# Patient Record
Sex: Male | Born: 1955
Health system: Southern US, Community
[De-identification: ages and names within clinical notes are randomized; demographics above are authoritative.]

## PROBLEM LIST (undated history)

## (undated) DIAGNOSIS — I10 Essential (primary) hypertension: Secondary | ICD-10-CM

## (undated) DIAGNOSIS — C61 Malignant neoplasm of prostate: Secondary | ICD-10-CM

## (undated) HISTORY — PX: OTHER SURGICAL HISTORY: SHX169

## (undated) HISTORY — PX: KIDNEY SURGERY: SHX687

---

## 1999-07-27 ENCOUNTER — Emergency Department (HOSPITAL_COMMUNITY): Admission: EM | Admit: 1999-07-27 | Discharge: 1999-07-28 | Payer: Self-pay | Admitting: Emergency Medicine

## 1999-07-28 ENCOUNTER — Encounter: Payer: Self-pay | Admitting: Emergency Medicine

## 1999-08-04 ENCOUNTER — Ambulatory Visit (HOSPITAL_COMMUNITY): Admission: RE | Admit: 1999-08-04 | Discharge: 1999-08-04 | Payer: Self-pay | Admitting: Urology

## 1999-08-04 ENCOUNTER — Encounter: Payer: Self-pay | Admitting: Urology

## 1999-08-21 ENCOUNTER — Encounter: Payer: Self-pay | Admitting: Urology

## 1999-08-21 ENCOUNTER — Ambulatory Visit (HOSPITAL_COMMUNITY): Admission: RE | Admit: 1999-08-21 | Discharge: 1999-08-21 | Payer: Self-pay | Admitting: Urology

## 2000-08-12 ENCOUNTER — Encounter: Payer: Self-pay | Admitting: Ophthalmology

## 2000-08-12 ENCOUNTER — Ambulatory Visit (HOSPITAL_COMMUNITY): Admission: RE | Admit: 2000-08-12 | Discharge: 2000-08-12 | Payer: Self-pay | Admitting: Ophthalmology

## 2000-08-27 ENCOUNTER — Encounter: Payer: Self-pay | Admitting: Urology

## 2000-08-27 ENCOUNTER — Ambulatory Visit (HOSPITAL_COMMUNITY): Admission: RE | Admit: 2000-08-27 | Discharge: 2000-08-27 | Payer: Self-pay | Admitting: Urology

## 2000-08-30 ENCOUNTER — Ambulatory Visit (HOSPITAL_COMMUNITY): Admission: RE | Admit: 2000-08-30 | Discharge: 2000-08-30 | Payer: Self-pay | Admitting: Urology

## 2000-08-30 ENCOUNTER — Encounter: Payer: Self-pay | Admitting: Urology

## 2000-09-27 ENCOUNTER — Inpatient Hospital Stay (HOSPITAL_COMMUNITY): Admission: RE | Admit: 2000-09-27 | Discharge: 2000-09-30 | Payer: Self-pay | Admitting: Urology

## 2000-11-08 ENCOUNTER — Ambulatory Visit (HOSPITAL_COMMUNITY): Admission: RE | Admit: 2000-11-08 | Discharge: 2000-11-08 | Payer: Self-pay | Admitting: Urology

## 2002-12-18 ENCOUNTER — Ambulatory Visit: Admission: RE | Admit: 2002-12-18 | Discharge: 2002-12-18 | Payer: Self-pay | Admitting: Pulmonary Disease

## 2003-02-19 ENCOUNTER — Ambulatory Visit: Admission: RE | Admit: 2003-02-19 | Discharge: 2003-02-19 | Payer: Self-pay | Admitting: Internal Medicine

## 2004-05-28 ENCOUNTER — Ambulatory Visit: Payer: Self-pay | Admitting: Cardiology

## 2004-05-28 ENCOUNTER — Ambulatory Visit (HOSPITAL_COMMUNITY): Admission: RE | Admit: 2004-05-28 | Discharge: 2004-05-28 | Payer: Self-pay | Admitting: Pulmonary Disease

## 2010-10-28 ENCOUNTER — Other Ambulatory Visit (HOSPITAL_COMMUNITY): Payer: Self-pay | Admitting: Urology

## 2010-10-28 DIAGNOSIS — C61 Malignant neoplasm of prostate: Secondary | ICD-10-CM

## 2010-11-06 ENCOUNTER — Encounter (HOSPITAL_COMMUNITY): Payer: Self-pay

## 2010-11-06 ENCOUNTER — Encounter (HOSPITAL_COMMUNITY)
Admission: RE | Admit: 2010-11-06 | Discharge: 2010-11-06 | Disposition: A | Discharge status: Home / Self Care | Payer: Federal, State, Local not specified - PPO | Source: Ambulatory Visit | Attending: Urology | Admitting: Urology

## 2010-11-06 DIAGNOSIS — C61 Malignant neoplasm of prostate: Secondary | ICD-10-CM | POA: Insufficient documentation

## 2010-11-06 HISTORY — DX: Malignant neoplasm of prostate: C61

## 2010-11-06 MED ORDER — TECHNETIUM TC 99M MEDRONATE IV KIT
23.4000 | PACK | Freq: Once | INTRAVENOUS | Status: AC | PRN
  Administered 2010-11-06: 23.4 via INTRAVENOUS

## 2010-11-25 ENCOUNTER — Other Ambulatory Visit: Payer: Self-pay | Admitting: Urology

## 2010-11-25 ENCOUNTER — Encounter (HOSPITAL_COMMUNITY): Payer: Federal, State, Local not specified - PPO

## 2010-11-25 ENCOUNTER — Ambulatory Visit (HOSPITAL_COMMUNITY)
Admission: RE | Admit: 2010-11-25 | Discharge: 2010-11-25 | Disposition: A | Discharge status: Home / Self Care | Payer: Federal, State, Local not specified - PPO | Source: Ambulatory Visit | Attending: Urology | Admitting: Urology

## 2010-11-25 ENCOUNTER — Other Ambulatory Visit (HOSPITAL_COMMUNITY): Payer: Self-pay | Admitting: Urology

## 2010-11-25 DIAGNOSIS — Z01818 Encounter for other preprocedural examination: Secondary | ICD-10-CM

## 2010-11-25 DIAGNOSIS — Z01812 Encounter for preprocedural laboratory examination: Secondary | ICD-10-CM | POA: Insufficient documentation

## 2010-11-25 DIAGNOSIS — Z01810 Encounter for preprocedural cardiovascular examination: Secondary | ICD-10-CM | POA: Insufficient documentation

## 2010-11-25 LAB — CBC
MCH: 28.5 pg (ref 26.0–34.0)
MCHC: 33.5 g/dL (ref 30.0–36.0)
MCV: 85 fL (ref 78.0–100.0)
Platelets: 222 10*3/uL (ref 150–400)
RBC: 4.81 MIL/uL (ref 4.22–5.81)
RDW: 12 % (ref 11.5–15.5)

## 2010-11-25 LAB — SURGICAL PCR SCREEN
MRSA, PCR: NEGATIVE
Staphylococcus aureus: NEGATIVE

## 2010-11-25 LAB — BASIC METABOLIC PANEL
Calcium: 9.6 mg/dL (ref 8.4–10.5)
Creatinine, Ser: 1.18 mg/dL (ref 0.50–1.35)
GFR calc non Af Amer: >60 mL/min (ref >60)
Sodium: 137 mEq/L (ref 135–145)

## 2010-12-01 ENCOUNTER — Inpatient Hospital Stay (HOSPITAL_COMMUNITY)
Admission: RE | Admit: 2010-12-01 | Discharge: 2010-12-02 | DRG: 335 | Disposition: A | Discharge status: Home / Self Care | Payer: Federal, State, Local not specified - PPO | Source: Ambulatory Visit | Attending: Urology | Admitting: Urology

## 2010-12-01 ENCOUNTER — Other Ambulatory Visit: Payer: Self-pay | Admitting: Urology

## 2010-12-01 DIAGNOSIS — Z01810 Encounter for preprocedural cardiovascular examination: Secondary | ICD-10-CM

## 2010-12-01 DIAGNOSIS — C61 Malignant neoplasm of prostate: Principal | ICD-10-CM | POA: Diagnosis present

## 2010-12-01 DIAGNOSIS — Y9210 Unspecified residential institution as the place of occurrence of the external cause: Secondary | ICD-10-CM | POA: Diagnosis not present

## 2010-12-01 DIAGNOSIS — Z01812 Encounter for preprocedural laboratory examination: Secondary | ICD-10-CM

## 2010-12-01 DIAGNOSIS — IMO0002 Reserved for concepts with insufficient information to code with codable children: Secondary | ICD-10-CM | POA: Diagnosis not present

## 2010-12-01 LAB — CBC
HCT: 38 % — ABNORMAL LOW (ref 39.0–52.0)
Hemoglobin: 12.7 g/dL — ABNORMAL LOW (ref 13.0–17.0)
MCH: 28.4 pg (ref 26.0–34.0)
MCHC: 33.4 g/dL (ref 30.0–36.0)
RBC: 4.47 MIL/uL (ref 4.22–5.81)

## 2010-12-01 LAB — BASIC METABOLIC PANEL
BUN: 12 mg/dL (ref 6–23)
CO2: 27 mEq/L (ref 19–32)
Calcium: 8.9 mg/dL (ref 8.4–10.5)
GFR calc non Af Amer: >60 mL/min (ref >60)
Glucose, Bld: 141 mg/dL — ABNORMAL HIGH (ref 70–99)
Potassium: 3.8 mEq/L (ref 3.5–5.1)
Sodium: 136 mEq/L (ref 135–145)

## 2010-12-02 LAB — BASIC METABOLIC PANEL
BUN: 9 mg/dL (ref 6–23)
Calcium: 8.8 mg/dL (ref 8.4–10.5)
Creatinine, Ser: 1.15 mg/dL (ref 0.50–1.35)
GFR calc Af Amer: >60 mL/min (ref >60)
GFR calc non Af Amer: >60 mL/min (ref >60)
Glucose, Bld: 125 mg/dL — ABNORMAL HIGH (ref 70–99)

## 2010-12-02 LAB — CBC
HCT: 36.9 % — ABNORMAL LOW (ref 39.0–52.0)
Hemoglobin: 12.5 g/dL — ABNORMAL LOW (ref 13.0–17.0)
MCH: 28.9 pg (ref 26.0–34.0)
MCHC: 33.9 g/dL (ref 30.0–36.0)
RDW: 12.3 % (ref 11.5–15.5)

## 2010-12-02 LAB — DIFFERENTIAL
Basophils Absolute: 0 10*3/uL (ref 0.0–0.1)
Eosinophils Relative: 2 % (ref 0–5)
Lymphocytes Relative: 13 % (ref 12–46)
Monocytes Absolute: 0.5 10*3/uL (ref 0.1–1.0)
Monocytes Relative: 8 % (ref 3–12)
Neutro Abs: 4.5 10*3/uL (ref 1.7–7.7)

## 2010-12-10 NOTE — Op Note (Signed)
NAMEJEZREEL, JUSTINIANO NO.:  [id number]  MEDICAL RECORD NO.:  [id number]  LOCATION:                                 FACILITY:  PHYSICIAN:  Valetta Fuller, MD    DATE OF BIRTH:  [birth year]  DATE OF PROCEDURE: DATE OF DISCHARGE:                              OPERATIVE REPORT   PREOPERATIVE DIAGNOSIS:  Adenocarcinoma of the prostate.  POSTOPERATIVE DIAGNOSIS:  Adenocarcinoma of the prostate.  PROCEDURE PERFORMED:  Robotic-assisted laparoscopic radical retropubic prostatectomy with bilateral pelvic lymph node dissection.  SURGEON:  Valetta Fuller, MD  FIRST ASSISTANT:  Delia Chimes, NP.  ANESTHESIA:  General endotracheal.  COMPLICATIONS:  Small rectal injury with intraoperative general surgery consultation.  INDICATIONS:  Mr. Longest is 55 years of age.  He was sent to see [place] with an elevated PSA of approximately 10.  Rectal exam revealed no obvious nodularity or induration.  An ultrasound was performed, which revealed a small but otherwise unremarkable prostate.  Right-sided biopsies were all negative other than some high-grade PIN and some atypia. Left-sided biopsies were positive in 6/6 cores.  Primarily, this was a Gleason 3 + 4 equals 7, but one core at the left base did show a primary Gleason 4 component.  Core involvement was in the 50% to 80% range.  One of the cores did show a question of some extraprostatic extension.  Perineal invasion was also noted.  The patient underwent extensive consultation with regard to his treatment options.  We went over [facility] nomograms.  We felt that given his young age, potentially aggressive disease that a surgical approach would offer him the best opportunity for not only careful pathologic staging, but his best chance at cure.  The patient appeared to understand the advantages and disadvantages of surgery.  He was felt to be a candidate only for unilateral nerve sparing on the right side, given his  extensive cancer on the left.  The patient did have a CT of the pelvis with contrast and bone scan, which failed to reveal gross evidence of metastatic disease. The patient has performed a mechanical bowel prep.  The patient had placement of PAS compression boots and has received perioperative Unasyn.  Full informed consent has been obtained and the patient presents now for the procedure.  TECHNIQUE AND FINDINGS:  The patient was brought to the operating room where he had successful induction of general endotracheal anesthesia. He was placed in low lithotomy position with all extremities carefully padded.  He was secured to the operative table, placed in steep Trendelenburg position, and then prepped and draped in the usual manner. Foley catheter was inserted sterilely on the field.  Initial camera port incision was chosen approximately 18 cm above the pubis symphysis just left of the umbilicus.  A standard open Hassan technique was utilized and placement of the initial 12-mm trocar and abdominal insufflation occurred without incident.  There was no obvious pathology within the pelvis.  All other trocars were placed with direct visual guidance including additional 12-mm and 5-mm assist port and three 8-mm robotic trocars.  With trocars placed, the surgical cart was then docked.  The bladder was filled and space of Retzius  developed primarily with electrocautery, scissors, and blunt dissecting technique.  A small amount of superficial fat over the endopelvic fascia and bladder neck was then removed with electrocautery scissors.  The endopelvic fascia was then opened widely bilaterally from base to apex.  Levator musculature was swept off the apex of the prostate on both sides, isolating the dorsal venous complex which was stapled with an ETS stapling device with excellent hemostasis.  Foley catheter balloon was used to identify the anterior bladder neck which was then transected with  electrocautery scissors down to the catheter, which was then retracted anteriorly.  Indigo carmine was given and we had quite a small bladder neck well away from the ureteral orifices.  No middle lobe component was identified.  The posterior bladder neck was then transected and dissection carried down to the vas deferens bilaterally, which were individually dissected free and then retracted anteriorly. Seminal vesicles were likewise dissected primarily with a combination of blunt dissection along with electrocautery scissors.  With the prostate retracted anteriorly, the posterior plane between the rectum and the prostate was established.  The majority of this plane was quite normal up until the extreme left apical region towards the mid prostate where there did appear to be some adherence.  Attention was then turned towards bilateral nerve spare.  On the right side, superficial fascia anterior laterally was incised and then swept posterior laterally until a nice groove was established.  This was carried out from the apex and lateral aspect of the urethra back towards the pedicle.  Then retracting the prostate anteriorly, we were able to identify the pedicle, which was taken down with an Enseal device, dropping down the neurovascular bundle quite nicely.  On the left side, no attempt at nerve spare was made.  We attempted to go out laterally as much as possible utilizing Enseal device to take down the pedicle as well as much of the posterior lateral tissue as possible.  As we concluded that dissection, we noticed that there was a small approximately 1 cm x 0.5 cm area of exposed rectal mucosa.  The edges of this rent were quite clean.  There was no evidence of any rectal spillage into the pelvis.  We went ahead and completed the prostate dissection by transecting the urethra anteriorly.  The catheter was then retracted and the posterior urethra was then transected.  A small amount of  rectourethralis muscle was sharply incised and the prostatic specimen after additional cold scissor dissection near the side of the rectal injury was then removed from the pelvis.  The pelvis was then copiously irrigated.  The rectal injury was again examined. Again, the edges appeared to be quite fresh and there was no rectal spillage.  We did ask Dr. Karie Soda from General Surgery to look at the injury.  He was able to come in to the operating room.  He concurred that a double layer closure with some interposition of tissue what appeared to be adequate and there did not appear to be an obvious indication of colostomy again given the very small injury, the presence of a good apparently complete bowel prep along with no evidence of any fecal spillage.  The rectal injury was then repaired.  A running 2-0 Vicryl suture was utilized to close the mucosa.  A second layer of interrupted 2-0 silk suture was utilized to buttress that closure.  We were then able to swing over some additional tissue to cover the site of the rectal injury and to  provide an additional layer of tissue between the injury and the overlying bladder.  Attention was then turned towards bilateral pelvic lymph node dissection.  Obturator packets extending up to the bifurcation of the iliac artery were taken bilaterally.  No pathologically enlarged lymph nodes were identified.  Obturator nerves were identified bilaterally and clips were used for small lymphatic channels as well as small vessels. The packets were sent separately for permanent analysis only.  Again, obturator nerves identified and preserved bilaterally.  Attention was then turned towards reconstruction.  The bladder neck required no additional closure.  Indigo carmine was again given and we were well away from the ureteral orifices.  The 6 o'clock urethral stump and 6 o'clock bladder neck positions were then reapproximated with a single 2-0 Vicryl  suture.  Once these structures were reapproximated, the rest anastomosis was done with a double-armed 3-0 Monocryl suture in a running 360-degree manner.  A new catheter was placed without difficulty.  Irrigation revealed no evidence of leakage.  Of note, after the rectal injury was complete but before the anastomosis was performed, the pelvis was again copiously irrigated.  Rectal insufflation was also performed, which revealed no evidence of obvious air bubbles or leakage and it appeared that the rectal closure was indeed watertight.  Again copious irrigation was also utilized.  A pelvic drain was placed through one of the robotic trocars and secured to the skin.  The prostate specimen was placed in the Endopouch retrieval bag.  The 12-mm assist site was closed with Vicryl suture with the aid of a suture passer.  All other trocars were taken out with direct visual guidance without evidence of bleeding.  The camera port incision was extended slightly to allow for removal of specimen and that fascia was then closed with a running Vicryl suture.  All port sites and incisions were infiltrated with Marcaine and closed with clips.  Estimated blood loss was 50 cc. Other than the small rectal rent, no other obvious complications or problems occurred.  The patient was brought to recovery room in stable condition.     Valetta Fuller, MD     DSG/MEDQ  D:  12/01/2010  T:  12/01/2010  Job:  [id number]  Electronically Signed by Barron Alvine M.D. on 12/10/2010 08:41:40 AM

## 2013-07-13 ENCOUNTER — Encounter (HOSPITAL_COMMUNITY): Payer: Self-pay | Admitting: Emergency Medicine

## 2013-07-13 ENCOUNTER — Emergency Department (HOSPITAL_COMMUNITY)
Admission: EM | Admit: 2013-07-13 | Discharge: 2013-07-13 | Disposition: A | Discharge status: Home / Self Care | Payer: Federal, State, Local not specified - PPO | Attending: Emergency Medicine | Admitting: Emergency Medicine

## 2013-07-13 DIAGNOSIS — Z8546 Personal history of malignant neoplasm of prostate: Secondary | ICD-10-CM | POA: Insufficient documentation

## 2013-07-13 DIAGNOSIS — E860 Dehydration: Secondary | ICD-10-CM | POA: Diagnosis present

## 2013-07-13 DIAGNOSIS — Z79899 Other long term (current) drug therapy: Secondary | ICD-10-CM | POA: Insufficient documentation

## 2013-07-13 DIAGNOSIS — R55 Syncope and collapse: Secondary | ICD-10-CM | POA: Diagnosis present

## 2013-07-13 DIAGNOSIS — I10 Essential (primary) hypertension: Secondary | ICD-10-CM | POA: Insufficient documentation

## 2013-07-13 HISTORY — DX: Essential (primary) hypertension: I10

## 2013-07-13 LAB — CBC WITH DIFFERENTIAL/PLATELET
Basophils Absolute: 0 10*3/uL (ref 0.0–0.1)
Basophils Relative: 0 % (ref 0–1)
EOS PCT: 0 % (ref 0–5)
Eosinophils Absolute: 0 10*3/uL (ref 0.0–0.7)
HEMATOCRIT: 39.4 % (ref 39.0–52.0)
HEMOGLOBIN: 13.1 g/dL (ref 13.0–17.0)
LYMPHS ABS: 0.7 10*3/uL (ref 0.7–4.0)
LYMPHS PCT: 12 % (ref 12–46)
MCH: 28.8 pg (ref 26.0–34.0)
MCHC: 33.2 g/dL (ref 30.0–36.0)
MCV: 86.6 fL (ref 78.0–100.0)
MONO ABS: 0.5 10*3/uL (ref 0.1–1.0)
Monocytes Relative: 8 % (ref 3–12)
Neutro Abs: 4.8 10*3/uL (ref 1.7–7.7)
Neutrophils Relative %: 80 % — ABNORMAL HIGH (ref 43–77)
Platelets: 177 10*3/uL (ref 150–400)
RBC: 4.55 MIL/uL (ref 4.22–5.81)
RDW: 12.3 % (ref 11.5–15.5)
WBC: 6 10*3/uL (ref 4.0–10.5)

## 2013-07-13 LAB — I-STAT CHEM 8, ED
BUN: 22 mg/dL (ref 6–23)
CHLORIDE: 96 meq/L (ref 96–112)
CREATININE: 1.8 mg/dL — AB (ref 0.50–1.35)
Calcium, Ion: 1.06 mmol/L — ABNORMAL LOW (ref 1.12–1.23)
GLUCOSE: 105 mg/dL — AB (ref 70–99)
HEMATOCRIT: 43 % (ref 39.0–52.0)
Hemoglobin: 14.6 g/dL (ref 13.0–17.0)
POTASSIUM: 3.6 meq/L — AB (ref 3.7–5.3)
SODIUM: 138 meq/L (ref 137–147)
TCO2: 27 mmol/L (ref 0–100)

## 2013-07-13 LAB — CBG MONITORING, ED: Glucose-Capillary: 110 mg/dL — ABNORMAL HIGH (ref 70–99)

## 2013-07-13 LAB — I-STAT TROPONIN, ED: Troponin i, poc: 0 ng/mL (ref 0.00–0.08)

## 2013-07-13 NOTE — ED Notes (Signed)
Patient passed out in [employer] twice today, both witnessed.  Patient felt lightheaded before he passed out.  CBG-126.  Denies chest pain, SOB, nausea, and vomiting.  EMS reports patient is orthostatic.  Patient receiving fluids per EMS. Patient reports not feeling well in the past several days.

## 2013-07-13 NOTE — Discharge Instructions (Signed)
Dehydration, Adult  Dehydration means your body does not have as much fluid as it needs. Your kidneys, brain, and heart will not work properly without the right amount of fluids and salt.   HOME CARE   Ask your doctor how to replace body fluid losses (rehydrate).   Drink enough fluids to keep your pee (urine) clear or pale yellow.   Drink small amounts of fluids often if you feel sick to your stomach (nauseous) or throw up (vomit).   Eat like you normally do.   Avoid:   Foods or drinks high in sugar.   Bubbly (carbonated) drinks.   Juice.   Very hot or cold fluids.   Drinks with caffeine.   Fatty, greasy foods.   Alcohol.   Tobacco.   Eating too much.   Gelatin desserts.   Wash your hands to avoid spreading germs (bacteria, viruses).   Only take medicine as told by your doctor.   Keep all doctor visits as told.  GET HELP RIGHT AWAY IF:    You cannot drink something without throwing up.   You get worse even with treatment.   Your vomit has blood in it or looks greenish.   Your poop (stool) has blood in it or looks black and tarry.   You have not peed in 6 to 8 hours.   You pee a small amount of very dark pee.   You have a fever.   You pass out (faint).   You have belly (abdominal) pain that gets worse or stays in one spot (localizes).   You have a rash, stiff neck, or bad headache.   You get easily annoyed, sleepy, or are hard to wake up.   You feel weak, dizzy, or very thirsty.  MAKE SURE YOU:    Understand these instructions.   Will watch your condition.   Will get help right away if you are not doing well or get worse.  Document Released: 01/31/2009 Document Revised: 06/29/2011 Document Reviewed: 11/24/2010  Exit[employer] Patient Information 2014 [employer].

## 2013-07-13 NOTE — ED Notes (Signed)
Bed: CZ66 Expected date:  Expected time:  Means of arrival:  Comments: Ems- syncope

## 2013-07-13 NOTE — ED Provider Notes (Signed)
CSN: [id number]     Arrival date & time 07/13/13  1404 History   First MD Initiated Contact with Patient 07/13/13 1500     Chief Complaint  Patient presents with  . Loss of Consciousness     (Consider location/radiation/quality/duration/timing/severity/associated sxs/prior Treatment) Patient is a 58 y.o. male presenting with syncope. The history is provided by the patient.  Loss of Consciousness Episode history: at least one episode, bystanders noted 2 episodes. Most recent episode:  Today Duration:  5 minutes Timing: once, possibly twice. Progression:  Resolved Chronicity: new, one episode of syncope 4 years ago. Context comment:  Standing in line Witnessed: yes   Relieved by:  Nothing Worsened by:  Nothing tried Ineffective treatments:  None tried Associated symptoms: no chest pain, no fever, no headaches, no nausea, no shortness of breath and no vomiting     Past Medical History  Diagnosis Date  . Prostate cancer   . Hypertension    History reviewed. No pertinent past surgical history. History reviewed. No pertinent family history. History  Substance Use Topics  . Smoking status: Not on file  . Smokeless tobacco: Not on file  . Alcohol Use: Not on file    Review of Systems  Constitutional: Negative for fever.       Malaise, body aches  HENT: Negative for drooling and rhinorrhea.   Eyes: Negative for pain.  Respiratory: Negative for cough and shortness of breath.   Cardiovascular: Positive for syncope. Negative for chest pain and leg swelling.  Gastrointestinal: Negative for nausea, vomiting, abdominal pain and diarrhea.  Genitourinary: Negative for dysuria and hematuria.  Musculoskeletal: Negative for gait problem and neck pain.  Skin: Negative for color change.  Neurological: Positive for light-headedness. Negative for numbness and headaches.  Hematological: Negative for adenopathy.  Psychiatric/Behavioral: Negative for behavioral problems.  All other systems  reviewed and are negative.      Allergies  Review of patient's allergies indicates no known allergies.  Home Medications   Current Outpatient Rx  Name  Route  Sig  Dispense  Refill  . guaiFENesin (MUCINEX) 600 MG 12 hr tablet   Oral   Take 1,200 mg by mouth daily as needed for cough or to loosen phlegm.         . hydrochlorothiazide (HYDRODIURIL) 25 MG tablet   Oral   Take 25 mg by mouth daily.          BP 124/90  Pulse 87  Temp(Src) 99.1 F (37.3 C) (Oral)  Resp 12  SpO2 98% Physical Exam  Nursing note and vitals reviewed. Constitutional: He is oriented to person, place, and time. He appears well-developed and well-nourished.  HENT:  Head: Normocephalic and atraumatic.  Right Ear: External ear normal.  Left Ear: External ear normal.  Nose: Nose normal.  Mouth/Throat: Oropharynx is clear and moist. No oropharyngeal exudate.  Eyes: Conjunctivae and EOM are normal. Pupils are equal, round, and reactive to light.  Neck: Normal range of motion. Neck supple.  Cardiovascular: Normal rate, regular rhythm, normal heart sounds and intact distal pulses.  Exam reveals no gallop and no friction rub.   No murmur heard. Pulmonary/Chest: Effort normal and breath sounds normal. No respiratory distress. He has no wheezes.  Abdominal: Soft. Bowel sounds are normal. He exhibits no distension. There is no tenderness. There is no rebound and no guarding.  Musculoskeletal: Normal range of motion. He exhibits no edema and no tenderness.  Neurological: He is alert and oriented to person, place, and time.  He has normal strength. No cranial nerve deficit or sensory deficit. He displays a negative Romberg sign. Coordination and gait normal.  alert, oriented x3 speech: normal in context and clarity memory: intact grossly cranial nerves II-XII: intact motor strength: full proximally and distally no involuntary movements or tremors sensation: intact to light touch diffusely  cerebellar:  finger-to-nose and heel-to-shin intact gait: normal forwards and backwards, normal tandem gait   Skin: Skin is warm and dry.  Psychiatric: He has a normal mood and affect. His behavior is normal.    ED Course  Procedures (including critical care time) Labs Review Labs Reviewed  CBC WITH DIFFERENTIAL - Abnormal; Notable for the following:    Neutrophils Relative % 80 (*)    All other components within normal limits  I-STAT CHEM 8, ED - Abnormal; Notable for the following:    Potassium 3.6 (*)    Creatinine, Ser 1.80 (*)    Glucose, Bld 105 (*)    Calcium, Ion 1.06 (*)    All other components within normal limits  CBG MONITORING, ED - Abnormal; Notable for the following:    Glucose-Capillary 110 (*)    All other components within normal limits  POCT CBG (FASTING - GLUCOSE)-MANUAL ENTRY  I-STAT TROPOININ, ED   Imaging Review No results found.   EKG Interpretation   Date/Time:  Thursday July 13 2013 14:09:24 EDT Ventricular Rate:  90 PR Interval:  136 QRS Duration: 83 QT Interval:  349 QTC Calculation: 427 R Axis:   55 Text Interpretation:  Sinus rhythm No significant change since last  tracing Confirmed by Tawan Corkern  MD, Brooklyn Jeff ([id number]) on 07/13/2013 3:30:59 PM      MDM   Final diagnoses:  Syncope  Mild dehydration    3:35 PM 58 y.o. male who presents with syncope. The patient states that his wife has been sick and having vomiting and diarrhea. He states that he began feeling and malaise, and body aches for several days ago. While he was walking in [employer] this morning he was feeling lightheaded. He notes that while standing in line his lightheadedness worse in any syncopized. Bystanders are reporting that he syncopized twice but he does not remember the second episode. He states that he began feeling better about 5-10 minutes later. He is afebrile and vital signs are unremarkable here and he is feeling better on exam. He is status post 1 L of IV fluid upon arrival.  He is not orthostatic. He has a normal neurologic exam. He does not think he hit his head. He denies HA, sob, cp. Currently asx.  He has a normal EKG. His labs are noncontributory. Likely neurocardiogenic w/ prodrome of worsening lightheadedness.   3:40 PM:  I have discussed the diagnosis/risks/treatment options with the patient and believe the pt to be eligible for discharge home to follow-up with his pcp in 2-3 days. He would like to go home now, w/ non-contrib workup, no significant comorbidities, I think this is reasonable. We also discussed returning to the ED immediately if new or worsening sx occur. We discussed the sx which are most concerning (e.g., cp, sob, further episodes of syncope) that necessitate immediate return. Medications administered to the patient during their visit and any new prescriptions provided to the patient are listed below.  Medications given during this visit Medications - No data to display  New Prescriptions   No medications on file       Blanchard Kelch, MD 07/13/13 [phone]

## 2013-08-10 ENCOUNTER — Other Ambulatory Visit: Payer: Self-pay | Admitting: Family Medicine

## 2013-08-10 DIAGNOSIS — R229 Localized swelling, mass and lump, unspecified: Principal | ICD-10-CM

## 2013-08-10 DIAGNOSIS — IMO0002 Reserved for concepts with insufficient information to code with codable children: Secondary | ICD-10-CM

## 2013-08-16 ENCOUNTER — Ambulatory Visit
Admission: RE | Admit: 2013-08-16 | Discharge: 2013-08-16 | Disposition: A | Discharge status: Home / Self Care | Payer: Federal, State, Local not specified - PPO | Source: Ambulatory Visit | Attending: Family Medicine | Admitting: Family Medicine

## 2013-08-16 ENCOUNTER — Encounter (INDEPENDENT_AMBULATORY_CARE_PROVIDER_SITE_OTHER): Payer: Self-pay

## 2013-08-16 DIAGNOSIS — IMO0002 Reserved for concepts with insufficient information to code with codable children: Secondary | ICD-10-CM

## 2013-08-16 DIAGNOSIS — R229 Localized swelling, mass and lump, unspecified: Principal | ICD-10-CM

## 2013-08-16 MED ORDER — IOHEXOL 300 MG/ML  SOLN
75.0000 mL | Freq: Once | INTRAMUSCULAR | Status: AC | PRN
  Administered 2013-08-16: 75 mL via INTRAVENOUS

## 2013-08-23 ENCOUNTER — Ambulatory Visit
Admission: RE | Admit: 2013-08-23 | Discharge: 2013-08-23 | Disposition: A | Discharge status: Home / Self Care | Payer: Federal, State, Local not specified - PPO | Source: Ambulatory Visit | Attending: Family Medicine | Admitting: Family Medicine

## 2013-08-23 ENCOUNTER — Other Ambulatory Visit: Payer: Self-pay | Admitting: Family Medicine

## 2013-08-23 DIAGNOSIS — R9389 Abnormal findings on diagnostic imaging of other specified body structures: Secondary | ICD-10-CM

## 2013-09-26 ENCOUNTER — Ambulatory Visit (INDEPENDENT_AMBULATORY_CARE_PROVIDER_SITE_OTHER): Payer: Federal, State, Local not specified - PPO | Admitting: General Surgery

## 2013-09-26 ENCOUNTER — Encounter (INDEPENDENT_AMBULATORY_CARE_PROVIDER_SITE_OTHER): Payer: Self-pay | Admitting: General Surgery

## 2013-09-26 VITALS — BP 132/84 | HR 84 | Temp 97.8°F | Ht 68.0 in | Wt 179.0 lb

## 2013-09-26 DIAGNOSIS — E65 Localized adiposity: Secondary | ICD-10-CM

## 2013-09-26 NOTE — Progress Notes (Signed)
Patient ID: Jim Drake, male   DOB: Aug 29, 1955, 58 y.o.   MRN: [id number]  Chief Complaint  Patient presents with  . Lipoma    HPI Jim Drake is a 58 y.o. male.  Posterior neck lipoma possible fat pad over prominent C-7 spinous process  HPI The patient states that this lump occurred very quickly and grew very quickly. It is symptomatic in terms of discomfort to Past Medical History  Diagnosis Date  . Prostate cancer   . Hypertension     History reviewed. No pertinent past surgical history.  Family History  Problem Relation Age of Onset  . Cancer Mother   . Kidney disease Father     Social History History  Substance Use Topics  . Smoking status: Never Smoker   . Smokeless tobacco: Not on file  . Alcohol Use: No    No Known Allergies  Current Outpatient Prescriptions  Medication Sig Dispense Refill  . atorvastatin (LIPITOR) 10 MG tablet Take 10 mg by mouth daily.      . hydrochlorothiazide (HYDRODIURIL) 25 MG tablet Take 25 mg by mouth daily.       No current facility-administered medications for this visit.    Review of Systems Review of Systems  All other systems reviewed and are negative.   Blood pressure 132/84, pulse 84, temperature 97.8 F (36.6 C), height 5\' 8"  (1.727 m), weight 179 lb (81.194 kg).  Physical Exam Physical Exam  Constitutional: He is oriented to person, place, and time. He appears well-developed and well-nourished.  buffed  HENT:  Head: Normocephalic and atraumatic.  Eyes: Conjunctivae and EOM are normal. Pupils are equal, round, and reactive to light.  Neck: Normal range of motion. Neck supple.    Cardiovascular: Normal rate, regular rhythm and normal heart sounds.   Pulmonary/Chest: Effort normal and breath sounds normal.  Abdominal: Soft. Bowel sounds are normal.  Musculoskeletal: Normal range of motion.  Neurological: He is alert and oriented to person, place, and time. He has normal reflexes.  Skin: Skin is warm  and dry.  Psychiatric: He has a normal mood and affect. His behavior is normal. Judgment and thought content normal.    Data Reviewed Reviewed CT scan of the neck  Assessment    Subcutaneous fatty tissue accumjulation in the posterior cervical area, not a consolidated area or encapsulated lipoma, possible from use of steroids. Patietnt denies the use of steroids.     Plan    Not amenable to surgical excision, this would only lead to a wound and possible complications.        Jim Drake 09/26/2013, 9:41 AM

## 2015-09-13 DIAGNOSIS — Z8546 Personal history of malignant neoplasm of prostate: Secondary | ICD-10-CM | POA: Diagnosis not present

## 2015-09-20 DIAGNOSIS — Z8546 Personal history of malignant neoplasm of prostate: Secondary | ICD-10-CM | POA: Diagnosis not present

## 2016-03-20 DIAGNOSIS — Z8546 Personal history of malignant neoplasm of prostate: Secondary | ICD-10-CM | POA: Diagnosis not present

## 2016-03-27 DIAGNOSIS — C61 Malignant neoplasm of prostate: Secondary | ICD-10-CM | POA: Diagnosis not present

## 2016-03-27 DIAGNOSIS — N5231 Erectile dysfunction following radical prostatectomy: Secondary | ICD-10-CM | POA: Diagnosis not present

## 2016-10-01 DIAGNOSIS — C61 Malignant neoplasm of prostate: Secondary | ICD-10-CM | POA: Diagnosis not present

## 2016-10-07 DIAGNOSIS — C61 Malignant neoplasm of prostate: Secondary | ICD-10-CM | POA: Diagnosis not present

## 2016-10-16 ENCOUNTER — Encounter (HOSPITAL_COMMUNITY): Payer: Self-pay

## 2016-10-16 ENCOUNTER — Emergency Department (HOSPITAL_COMMUNITY)
Admission: EM | Admit: 2016-10-16 | Discharge: 2016-10-16 | Disposition: A | Discharge status: Home / Self Care | Payer: Federal, State, Local not specified - PPO | Attending: Emergency Medicine | Admitting: Emergency Medicine

## 2016-10-16 DIAGNOSIS — I10 Essential (primary) hypertension: Secondary | ICD-10-CM | POA: Insufficient documentation

## 2016-10-16 DIAGNOSIS — Z8546 Personal history of malignant neoplasm of prostate: Secondary | ICD-10-CM | POA: Insufficient documentation

## 2016-10-16 DIAGNOSIS — R55 Syncope and collapse: Secondary | ICD-10-CM | POA: Diagnosis not present

## 2016-10-16 DIAGNOSIS — E860 Dehydration: Secondary | ICD-10-CM | POA: Insufficient documentation

## 2016-10-16 DIAGNOSIS — Z79899 Other long term (current) drug therapy: Secondary | ICD-10-CM | POA: Insufficient documentation

## 2016-10-16 LAB — URINALYSIS, ROUTINE W REFLEX MICROSCOPIC
BACTERIA UA: NONE SEEN
BILIRUBIN URINE: NEGATIVE
Glucose, UA: NEGATIVE mg/dL
HGB URINE DIPSTICK: NEGATIVE
Ketones, ur: NEGATIVE mg/dL
Leukocytes, UA: NEGATIVE
NITRITE: NEGATIVE
PH: 5 (ref 5.0–8.0)
Protein, ur: 100 mg/dL — AB
Specific Gravity, Urine: 1.021 (ref 1.005–1.030)

## 2016-10-16 LAB — BASIC METABOLIC PANEL
ANION GAP: 11 (ref 5–15)
BUN: 13 mg/dL (ref 6–20)
CALCIUM: 9.9 mg/dL (ref 8.9–10.3)
CO2: 24 mmol/L (ref 22–32)
Chloride: 101 mmol/L (ref 101–111)
Creatinine, Ser: 1.34 mg/dL — ABNORMAL HIGH (ref 0.61–1.24)
GFR calc Af Amer: >60 mL/min (ref >60)
GFR calc non Af Amer: 56 mL/min — ABNORMAL LOW (ref >60)
Glucose, Bld: 186 mg/dL — ABNORMAL HIGH (ref 65–99)
Potassium: 3.7 mmol/L (ref 3.5–5.1)
Sodium: 136 mmol/L (ref 135–145)

## 2016-10-16 LAB — CBC
HCT: 45.2 % (ref 39.0–52.0)
HEMOGLOBIN: 14.9 g/dL (ref 13.0–17.0)
MCH: 28.5 pg (ref 26.0–34.0)
MCHC: 33 g/dL (ref 30.0–36.0)
MCV: 86.4 fL (ref 78.0–100.0)
Platelets: 280 10*3/uL (ref 150–400)
RBC: 5.23 MIL/uL (ref 4.22–5.81)
RDW: 12.5 % (ref 11.5–15.5)
WBC: 8.8 10*3/uL (ref 4.0–10.5)

## 2016-10-16 NOTE — ED Triage Notes (Signed)
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  Pt reports he was out in the sun x 2 hours, didn't hydrate himself.  Reports nausea and vomiting after episode.

## 2016-10-16 NOTE — ED Notes (Signed)
Pt denies nausea after drinking water.

## 2016-10-16 NOTE — ED Provider Notes (Signed)
[facility] DEPT Provider Note   CSN: [id number] Arrival date & time: 10/16/16  1337     History   Chief Complaint Chief Complaint  Patient presents with  . Loss of Consciousness    HPI Jim Drake is a 61 y.o. male.  HPI  Patient is a 61 year old male past medical history significant for hypertension, who presents to the emergency Department following a syncopal episode. Patient states that he works Licensed [employer] it [employer] for a living. 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 He started to feel lightheaded like he was going to pass out. Patient went to get his friend who is outside with him. His friend help lowered him to the ground. Patient lost consciousness for less than 1 minute. Immediately returned to baseline mental status. No shaking episode witnessed, no urinary incontinence or tongue biting. Denies headache, change in vision, chest pain, shortness of breath, diaphoresis. States that he tried to drink some water immediately afterwards and vomited. Currently without complaints and states that he feels fine.  Past Medical History:  Diagnosis Date  . Hypertension   . Prostate cancer [facility])     Patient Active Problem List   Diagnosis Date Noted  . Hyperplasia, fatty tissue of the neck 09/26/2013  . Mild dehydration 07/13/2013  . Syncope 07/13/2013    History reviewed. No pertinent surgical history.     Home Medications    Prior to Admission medications   Medication Sig Start Date End Date Taking? Authorizing Provider  cetirizine (ZYRTEC) 10 MG tablet Take 10 mg by mouth daily.   Yes [provider]  hydrochlorothiazide (HYDRODIURIL) 25 MG tablet Take 25 mg by mouth daily.   Yes [provider]  atorvastatin (LIPITOR) 10 MG tablet Take 10 mg by mouth daily.    [provider]    Family History Family History  Problem Relation Age of Onset  . Cancer Mother   . Kidney disease Father     Social  History Social History  Substance Use Topics  . Smoking status: Never Smoker  . Smokeless tobacco: Never Used  . Alcohol use No     Allergies   Patient has no known allergies.   Review of Systems Review of Systems  Constitutional: Negative for appetite change and fever.  HENT: Negative for congestion.   Eyes: Negative for visual disturbance.  Respiratory: Negative for chest tightness and shortness of breath.   Cardiovascular: Negative for chest pain and palpitations.  Gastrointestinal: Negative for abdominal pain, blood in stool, nausea and vomiting.  Genitourinary: Negative for decreased urine volume and dysuria.  Musculoskeletal: Negative for back pain.  Skin: Negative for rash.  Neurological: Positive for syncope and light-headedness. Negative for dizziness, seizures, weakness and headaches.  Psychiatric/Behavioral: Negative for behavioral problems.     Physical Exam Updated Vital Signs BP (!) 142/99   Pulse 67   Temp 97.8 F (36.6 C) (Oral)   Resp 15   Ht 5\' 8"  (1.727 m)   Wt 79.4 kg (175 lb)   SpO2 96%   BMI 26.61 kg/m   Physical Exam  Constitutional: He is oriented to person, place, and time. He appears well-developed and well-nourished. No distress.  HENT:  Head: Atraumatic.  Mouth/Throat: Oropharynx is clear and moist.  Eyes: Conjunctivae and EOM are normal.  Neck: Normal range of motion.  Cardiovascular: Normal rate, regular rhythm, normal heart sounds and intact distal pulses.   No murmur heard. Pulmonary/Chest: Effort normal and breath  sounds normal. No respiratory distress.  Abdominal: Soft. He exhibits no distension. There is no tenderness.  Musculoskeletal: Normal range of motion. He exhibits no edema.  No unilateral leg swelling  Neurological: He is alert and oriented to person, place, and time. He has normal strength. No cranial nerve deficit or sensory deficit. GCS eye subscore is 4. GCS verbal subscore is 5. GCS motor subscore is 6.  Skin:  Skin is warm.  Psychiatric: He has a normal mood and affect.     ED Treatments / Results  Labs (all labs ordered are listed, but only abnormal results are displayed) Labs Reviewed  BASIC METABOLIC PANEL - Abnormal; Notable for the following:       Result Value   Glucose, Bld 186 (*)    Creatinine, Ser 1.34 (*)    GFR calc non Af Amer 56 (*)    All other components within normal limits  URINALYSIS, ROUTINE W REFLEX MICROSCOPIC - Abnormal; Notable for the following:    APPearance HAZY (*)    Protein, ur 100 (*)    Squamous Epithelial / LPF 0-5 (*)    All other components within normal limits  CBC  CBG MONITORING, ED    EKG  EKG Interpretation  Date/Time:  Friday October 16 2016 13:52:44 EDT Ventricular Rate:  92 PR Interval:  134 QRS Duration: 76 QT Interval:  366 QTC Calculation: 452 R Axis:   73 Text Interpretation:  Normal sinus rhythm Nonspecific ST abnormality No significant change since last tracing Abnormal ekg Confirmed by Carmin Muskrat [phone]) on 10/16/2016 4:21:22 PM       Radiology No results found.  Procedures Procedures (including critical care time)  Medications Ordered in ED Medications - No data to display   Initial Impression / Assessment and Plan / ED Course  I have reviewed the triage vital signs and the nursing notes.  Pertinent labs & imaging results that were available during my care of the patient were reviewed by me and considered in my medical decision making (see chart for details).     61 year old male with no significant past medical history who presents to the emergency department with a syncopal episode. No preceding symptoms. No seizure-like activity with episode. Immediately returned to baseline with no postictal state. Currently without complaints. Benign abdominal exam, nonfocal neurologic exam, no murmur, symmetric pulses.  EKG showed normal sinus rhythm, normal intervals, no chamber enlargement, no signs of acute ischemia.  Nonspecific ST changes unchanged from prior. No significant electrolyte abnormalities. Creatinine is at baseline. Hemoglobin stable.  Patient most likely had a vasovagal episode secondary to being out in the heat and dehydrated. No exertional syncope or chest pain, doubt ACS. EKG is not consistent with WPW, LVH, Brugada syndrome. Patient was monitored in the emergency department without dysrhythmias. Doubt PE, no risk factors, no dyspnea. Patient able to tolerate by mouth in the emergency department.  Patient stable for discharge home. Discussed follow-up with primary care physician. Given strict return precautions to the emergency department. Patient expressed understanding, no questions or concerns at time of discharge.  Final Clinical Impressions(s) / ED Diagnoses   Final diagnoses:  Syncope, unspecified syncope type  Dehydration    New Prescriptions New Prescriptions   No medications on file     Nathaniel Man, MD 10/16/16 1756    Carmin Muskrat, MD 10/17/16 0030

## 2016-11-30 DIAGNOSIS — Z0000 Encounter for general adult medical examination without abnormal findings: Secondary | ICD-10-CM | POA: Diagnosis not present

## 2016-11-30 DIAGNOSIS — I10 Essential (primary) hypertension: Secondary | ICD-10-CM | POA: Diagnosis not present

## 2016-11-30 DIAGNOSIS — Z8546 Personal history of malignant neoplasm of prostate: Secondary | ICD-10-CM | POA: Diagnosis not present

## 2016-12-01 DIAGNOSIS — Z0000 Encounter for general adult medical examination without abnormal findings: Secondary | ICD-10-CM | POA: Diagnosis not present

## 2017-04-28 DIAGNOSIS — M9902 Segmental and somatic dysfunction of thoracic region: Secondary | ICD-10-CM | POA: Diagnosis not present

## 2017-04-28 DIAGNOSIS — M47812 Spondylosis without myelopathy or radiculopathy, cervical region: Secondary | ICD-10-CM | POA: Diagnosis not present

## 2017-04-28 DIAGNOSIS — M9903 Segmental and somatic dysfunction of lumbar region: Secondary | ICD-10-CM | POA: Diagnosis not present

## 2017-04-28 DIAGNOSIS — M9901 Segmental and somatic dysfunction of cervical region: Secondary | ICD-10-CM | POA: Diagnosis not present

## 2017-05-03 DIAGNOSIS — M9902 Segmental and somatic dysfunction of thoracic region: Secondary | ICD-10-CM | POA: Diagnosis not present

## 2017-05-03 DIAGNOSIS — M47812 Spondylosis without myelopathy or radiculopathy, cervical region: Secondary | ICD-10-CM | POA: Diagnosis not present

## 2017-05-03 DIAGNOSIS — M9903 Segmental and somatic dysfunction of lumbar region: Secondary | ICD-10-CM | POA: Diagnosis not present

## 2017-05-03 DIAGNOSIS — M9901 Segmental and somatic dysfunction of cervical region: Secondary | ICD-10-CM | POA: Diagnosis not present

## 2017-05-05 DIAGNOSIS — M9901 Segmental and somatic dysfunction of cervical region: Secondary | ICD-10-CM | POA: Diagnosis not present

## 2017-05-05 DIAGNOSIS — M9902 Segmental and somatic dysfunction of thoracic region: Secondary | ICD-10-CM | POA: Diagnosis not present

## 2017-05-05 DIAGNOSIS — M9903 Segmental and somatic dysfunction of lumbar region: Secondary | ICD-10-CM | POA: Diagnosis not present

## 2017-05-05 DIAGNOSIS — M47812 Spondylosis without myelopathy or radiculopathy, cervical region: Secondary | ICD-10-CM | POA: Diagnosis not present

## 2017-05-12 DIAGNOSIS — M9903 Segmental and somatic dysfunction of lumbar region: Secondary | ICD-10-CM | POA: Diagnosis not present

## 2017-05-12 DIAGNOSIS — M9901 Segmental and somatic dysfunction of cervical region: Secondary | ICD-10-CM | POA: Diagnosis not present

## 2017-05-12 DIAGNOSIS — M9902 Segmental and somatic dysfunction of thoracic region: Secondary | ICD-10-CM | POA: Diagnosis not present

## 2017-05-12 DIAGNOSIS — M47812 Spondylosis without myelopathy or radiculopathy, cervical region: Secondary | ICD-10-CM | POA: Diagnosis not present

## 2017-05-17 DIAGNOSIS — M9902 Segmental and somatic dysfunction of thoracic region: Secondary | ICD-10-CM | POA: Diagnosis not present

## 2017-05-17 DIAGNOSIS — M9903 Segmental and somatic dysfunction of lumbar region: Secondary | ICD-10-CM | POA: Diagnosis not present

## 2017-05-17 DIAGNOSIS — M9901 Segmental and somatic dysfunction of cervical region: Secondary | ICD-10-CM | POA: Diagnosis not present

## 2017-05-17 DIAGNOSIS — M47812 Spondylosis without myelopathy or radiculopathy, cervical region: Secondary | ICD-10-CM | POA: Diagnosis not present

## 2017-06-14 DIAGNOSIS — M47812 Spondylosis without myelopathy or radiculopathy, cervical region: Secondary | ICD-10-CM | POA: Diagnosis not present

## 2017-06-14 DIAGNOSIS — M9902 Segmental and somatic dysfunction of thoracic region: Secondary | ICD-10-CM | POA: Diagnosis not present

## 2017-06-14 DIAGNOSIS — M9903 Segmental and somatic dysfunction of lumbar region: Secondary | ICD-10-CM | POA: Diagnosis not present

## 2017-06-14 DIAGNOSIS — M9901 Segmental and somatic dysfunction of cervical region: Secondary | ICD-10-CM | POA: Diagnosis not present

## 2017-06-16 DIAGNOSIS — M9903 Segmental and somatic dysfunction of lumbar region: Secondary | ICD-10-CM | POA: Diagnosis not present

## 2017-06-16 DIAGNOSIS — M9901 Segmental and somatic dysfunction of cervical region: Secondary | ICD-10-CM | POA: Diagnosis not present

## 2017-06-16 DIAGNOSIS — M47812 Spondylosis without myelopathy or radiculopathy, cervical region: Secondary | ICD-10-CM | POA: Diagnosis not present

## 2017-06-16 DIAGNOSIS — M9902 Segmental and somatic dysfunction of thoracic region: Secondary | ICD-10-CM | POA: Diagnosis not present

## 2017-06-22 DIAGNOSIS — M9903 Segmental and somatic dysfunction of lumbar region: Secondary | ICD-10-CM | POA: Diagnosis not present

## 2017-06-22 DIAGNOSIS — M47812 Spondylosis without myelopathy or radiculopathy, cervical region: Secondary | ICD-10-CM | POA: Diagnosis not present

## 2017-06-22 DIAGNOSIS — M9901 Segmental and somatic dysfunction of cervical region: Secondary | ICD-10-CM | POA: Diagnosis not present

## 2017-06-22 DIAGNOSIS — M9902 Segmental and somatic dysfunction of thoracic region: Secondary | ICD-10-CM | POA: Diagnosis not present

## 2017-06-28 DIAGNOSIS — M47812 Spondylosis without myelopathy or radiculopathy, cervical region: Secondary | ICD-10-CM | POA: Diagnosis not present

## 2017-06-28 DIAGNOSIS — M9901 Segmental and somatic dysfunction of cervical region: Secondary | ICD-10-CM | POA: Diagnosis not present

## 2017-06-28 DIAGNOSIS — M9902 Segmental and somatic dysfunction of thoracic region: Secondary | ICD-10-CM | POA: Diagnosis not present

## 2017-06-28 DIAGNOSIS — M9903 Segmental and somatic dysfunction of lumbar region: Secondary | ICD-10-CM | POA: Diagnosis not present

## 2017-07-07 DIAGNOSIS — M47812 Spondylosis without myelopathy or radiculopathy, cervical region: Secondary | ICD-10-CM | POA: Diagnosis not present

## 2017-07-07 DIAGNOSIS — M9903 Segmental and somatic dysfunction of lumbar region: Secondary | ICD-10-CM | POA: Diagnosis not present

## 2017-07-07 DIAGNOSIS — M9902 Segmental and somatic dysfunction of thoracic region: Secondary | ICD-10-CM | POA: Diagnosis not present

## 2017-07-07 DIAGNOSIS — M9901 Segmental and somatic dysfunction of cervical region: Secondary | ICD-10-CM | POA: Diagnosis not present

## 2017-08-05 DIAGNOSIS — M47812 Spondylosis without myelopathy or radiculopathy, cervical region: Secondary | ICD-10-CM | POA: Diagnosis not present

## 2017-08-05 DIAGNOSIS — M9902 Segmental and somatic dysfunction of thoracic region: Secondary | ICD-10-CM | POA: Diagnosis not present

## 2017-08-05 DIAGNOSIS — M9901 Segmental and somatic dysfunction of cervical region: Secondary | ICD-10-CM | POA: Diagnosis not present

## 2017-08-05 DIAGNOSIS — M9903 Segmental and somatic dysfunction of lumbar region: Secondary | ICD-10-CM | POA: Diagnosis not present

## 2017-08-09 DIAGNOSIS — M9902 Segmental and somatic dysfunction of thoracic region: Secondary | ICD-10-CM | POA: Diagnosis not present

## 2017-08-09 DIAGNOSIS — M47812 Spondylosis without myelopathy or radiculopathy, cervical region: Secondary | ICD-10-CM | POA: Diagnosis not present

## 2017-08-09 DIAGNOSIS — M9901 Segmental and somatic dysfunction of cervical region: Secondary | ICD-10-CM | POA: Diagnosis not present

## 2017-08-09 DIAGNOSIS — M9903 Segmental and somatic dysfunction of lumbar region: Secondary | ICD-10-CM | POA: Diagnosis not present

## 2017-09-30 DIAGNOSIS — C61 Malignant neoplasm of prostate: Secondary | ICD-10-CM | POA: Diagnosis not present

## 2017-10-04 DIAGNOSIS — R9721 Rising PSA following treatment for malignant neoplasm of prostate: Secondary | ICD-10-CM | POA: Diagnosis not present

## 2017-10-04 DIAGNOSIS — N5231 Erectile dysfunction following radical prostatectomy: Secondary | ICD-10-CM | POA: Diagnosis not present

## 2017-10-04 DIAGNOSIS — C61 Malignant neoplasm of prostate: Secondary | ICD-10-CM | POA: Diagnosis not present

## 2017-10-11 DIAGNOSIS — M9903 Segmental and somatic dysfunction of lumbar region: Secondary | ICD-10-CM | POA: Diagnosis not present

## 2017-10-11 DIAGNOSIS — M47812 Spondylosis without myelopathy or radiculopathy, cervical region: Secondary | ICD-10-CM | POA: Diagnosis not present

## 2017-10-11 DIAGNOSIS — M9902 Segmental and somatic dysfunction of thoracic region: Secondary | ICD-10-CM | POA: Diagnosis not present

## 2017-10-11 DIAGNOSIS — M9901 Segmental and somatic dysfunction of cervical region: Secondary | ICD-10-CM | POA: Diagnosis not present

## 2017-10-13 DIAGNOSIS — M47812 Spondylosis without myelopathy or radiculopathy, cervical region: Secondary | ICD-10-CM | POA: Diagnosis not present

## 2017-10-13 DIAGNOSIS — M9903 Segmental and somatic dysfunction of lumbar region: Secondary | ICD-10-CM | POA: Diagnosis not present

## 2017-10-13 DIAGNOSIS — M9901 Segmental and somatic dysfunction of cervical region: Secondary | ICD-10-CM | POA: Diagnosis not present

## 2017-10-13 DIAGNOSIS — M9902 Segmental and somatic dysfunction of thoracic region: Secondary | ICD-10-CM | POA: Diagnosis not present

## 2017-12-01 DIAGNOSIS — Z0000 Encounter for general adult medical examination without abnormal findings: Secondary | ICD-10-CM | POA: Diagnosis not present

## 2017-12-03 DIAGNOSIS — Z0000 Encounter for general adult medical examination without abnormal findings: Secondary | ICD-10-CM | POA: Diagnosis not present

## 2018-03-28 DIAGNOSIS — C61 Malignant neoplasm of prostate: Secondary | ICD-10-CM | POA: Diagnosis not present

## 2018-04-05 DIAGNOSIS — C61 Malignant neoplasm of prostate: Secondary | ICD-10-CM | POA: Diagnosis not present

## 2018-04-05 DIAGNOSIS — N5231 Erectile dysfunction following radical prostatectomy: Secondary | ICD-10-CM | POA: Diagnosis not present

## 2018-04-11 DIAGNOSIS — M9901 Segmental and somatic dysfunction of cervical region: Secondary | ICD-10-CM | POA: Diagnosis not present

## 2018-04-11 DIAGNOSIS — M9902 Segmental and somatic dysfunction of thoracic region: Secondary | ICD-10-CM | POA: Diagnosis not present

## 2018-04-11 DIAGNOSIS — M9906 Segmental and somatic dysfunction of lower extremity: Secondary | ICD-10-CM | POA: Diagnosis not present

## 2018-04-11 DIAGNOSIS — M9903 Segmental and somatic dysfunction of lumbar region: Secondary | ICD-10-CM | POA: Diagnosis not present

## 2018-04-18 ENCOUNTER — Encounter: Payer: Self-pay | Admitting: Radiation Oncology

## 2018-04-18 DIAGNOSIS — M9906 Segmental and somatic dysfunction of lower extremity: Secondary | ICD-10-CM | POA: Diagnosis not present

## 2018-04-18 DIAGNOSIS — M9902 Segmental and somatic dysfunction of thoracic region: Secondary | ICD-10-CM | POA: Diagnosis not present

## 2018-04-18 DIAGNOSIS — M9901 Segmental and somatic dysfunction of cervical region: Secondary | ICD-10-CM | POA: Diagnosis not present

## 2018-04-18 DIAGNOSIS — M9903 Segmental and somatic dysfunction of lumbar region: Secondary | ICD-10-CM | POA: Diagnosis not present

## 2018-04-18 NOTE — Progress Notes (Signed)
GU Location of Tumor / Histology: prostatic adenocarcinoma  If Prostate Cancer, Gleason Score is (3+4) and PSA (10) pretreatment.   Jim Drake is s/p RALP in  11/2010. His PSA was undetectable immediately following surgery, but started to very slowly rise over the past 3 years.   03/2018 PSA  0.13 09/30/2017 PSA  0.103 09/2016  PSA  0.06    Past/Anticipated interventions by urology, if any: prostate biopsy, RALP, bone scan (negative), referral to radiation oncology  Past/Anticipated interventions by medical oncology, if any: no  Weight changes, if any: no  Bowel/Bladder complaints, if any: Describes a full and steady urine stream without difficulty emptying his bladder. Reports occasional urgency and frequency of small volumes of urine,. Reports nocturia x 1. Denies dysuria or hematuria.  Reports urinary leakage since RALP. IPSS 1. SHIM 3.  Nausea/Vomiting, if any: no  Pain issues, if any:  no  SAFETY ISSUES:  Prior radiation? no  Pacemaker/ICD? no  Possible current pregnancy? no  Is the patient on methotrexate? no  Current Complaints / other details:  62 year old male. Married.  NKDA

## 2018-04-19 ENCOUNTER — Ambulatory Visit
Admission: RE | Admit: 2018-04-19 | Discharge: 2018-04-19 | Disposition: A | Discharge status: Home / Self Care | Payer: Federal, State, Local not specified - PPO | Source: Ambulatory Visit | Attending: Radiation Oncology | Admitting: Radiation Oncology

## 2018-04-19 ENCOUNTER — Encounter: Payer: Self-pay | Admitting: Medical Oncology

## 2018-04-19 ENCOUNTER — Encounter: Payer: Self-pay | Admitting: Radiation Oncology

## 2018-04-19 ENCOUNTER — Other Ambulatory Visit: Payer: Self-pay

## 2018-04-19 VITALS — BP 140/96 | HR 96 | Temp 98.1°F | Resp 18 | Wt 179.4 lb

## 2018-04-19 DIAGNOSIS — Z79899 Other long term (current) drug therapy: Secondary | ICD-10-CM | POA: Insufficient documentation

## 2018-04-19 DIAGNOSIS — Z9079 Acquired absence of other genital organ(s): Secondary | ICD-10-CM | POA: Insufficient documentation

## 2018-04-19 DIAGNOSIS — C61 Malignant neoplasm of prostate: Secondary | ICD-10-CM | POA: Diagnosis not present

## 2018-04-19 DIAGNOSIS — R9721 Rising PSA following treatment for malignant neoplasm of prostate: Secondary | ICD-10-CM | POA: Diagnosis not present

## 2018-04-19 DIAGNOSIS — I10 Essential (primary) hypertension: Secondary | ICD-10-CM | POA: Diagnosis not present

## 2018-04-19 NOTE — Addendum Note (Signed)
Encounter addended by: Tyler Pita, MD on: 04/19/2018 5:14 PM  Actions taken: LOS modified

## 2018-04-19 NOTE — Progress Notes (Signed)
Radiation Oncology         [phone] ________________________________  Initial outpatient Consultation  Name: Jim Drake MRN: [id number]  Date: 04/19/2018  DOB: Feb 02, 1956  SE:GBTDVVO, Dibas, MD  Davis Gourd*   REFERRING PHYSICIAN: Davis Gourd*  DIAGNOSIS: 62 y.o. gentleman with biochemical recurrent Stage pT2c adenocarcinoma of the prostate with Gleason score of 3+4, and a post-prostatectomy rising detectable PSA of 0.13    ICD-10-CM   1. Malignant neoplasm of prostate ([place]) C61     HISTORY OF PRESENT ILLNESS: Jim Drake is a 62 y.o. male with a diagnosis of prostate cancer. He is 7 years s/p radical prostatectomy, performed under Dr. Risa Grill. Pathology from the procedure on 11/26/10 showed Gleason 3+4 with positive left posterior middle margin, where capsule transected. Dr. Risa Grill noted that they opted to carefully monitor him due to his positive margin. He was noted to have a detectable, rising PSA in 2015, with most recent value of 0.13 on 03/28/18.  The patient reviewed the biopsy results with his urologist and he has kindly been referred today for discussion of potential radiation treatment options.   PREVIOUS RADIATION THERAPY: No  PAST MEDICAL HISTORY:  Past Medical History:  Diagnosis Date  . Hypertension   . Prostate cancer ([place])       PAST SURGICAL HISTORY: Past Surgical History:  Procedure Laterality Date  . KIDNEY SURGERY    . prostatectomy    . repair of ruptured achilles tendon      FAMILY HISTORY:  Family History  Problem Relation Age of Onset  . Melanoma Mother   . Kidney disease Father     SOCIAL HISTORY:  Social History   Socioeconomic History  . Marital status: Divorced    Spouse name: Not on file  . Number of children: Not on file  . Years of education: Not on file  . Highest education level: Not on file  Occupational History    Comment: full time  Social Needs  . Financial resource strain: Not on file    . Food insecurity:    Worry: Not on file    Inability: Not on file  . Transportation needs:    Medical: Not on file    Non-medical: Not on file  Tobacco Use  . Smoking status: Never Smoker  . Smokeless tobacco: Never Used  Substance and Sexual Activity  . Alcohol use: No  . Drug use: No  . Sexual activity: Not Currently  Lifestyle  . Physical activity:    Days per week: Not on file    Minutes per session: Not on file  . Stress: Not on file  Relationships  . Social connections:    Talks on phone: Not on file    Gets together: Not on file    Attends religious service: Not on file    Active member of club or organization: Not on file    Attends meetings of clubs or organizations: Not on file    Relationship status: Not on file  . Intimate partner violence:    Fear of current or ex partner: Not on file    Emotionally abused: Not on file    Physically abused: Not on file    Forced sexual activity: Not on file  Other Topics Concern  . Not on file  Social History Narrative  . Not on file    ALLERGIES: Other  MEDICATIONS:  Current Outpatient Medications  Medication Sig Dispense Refill  . BLACK CURRANT SEED OIL PO Take  by mouth.    . hydrochlorothiazide (HYDRODIURIL) 25 MG tablet Take 25 mg by mouth daily.    . pravastatin (PRAVACHOL) 20 MG tablet Take 20 mg by mouth daily.     No current facility-administered medications for this encounter.     REVIEW OF SYSTEMS:  On review of systems, the patient reports that he is doing well overall. He denies any chest pain, shortness of breath, cough, fevers, chills, night sweats, unintended weight changes. He denies any bowel disturbances, and denies abdominal pain, nausea or vomiting. He denies any new musculoskeletal or joint aches or pains. His IPSS was 1, indicating mild to no urinary symptoms. His SHIM was 3, indicating he does have severe erectile dysfunction. A complete review of systems is obtained and is otherwise negative. He  is accompanied by his wife today.    PHYSICAL EXAM:  Wt Readings from Last 3 Encounters:  04/19/18 179 lb 6.4 oz (81.4 kg)  10/16/16 175 lb (79.4 kg)  09/26/13 179 lb (81.2 kg)   Temp Readings from Last 3 Encounters:  04/19/18 98.1 F (36.7 C)  10/16/16 97.8 F (36.6 C) (Oral)  09/26/13 97.8 F (36.6 C)   BP Readings from Last 3 Encounters:  04/19/18 (!) 140/96  10/16/16 (!) 142/99  09/26/13 132/84   Pulse Readings from Last 3 Encounters:  04/19/18 96  10/16/16 67  09/26/13 84   Pain Assessment Pain Score: 0-No pain/10  In general this is a well appearing African American gentleman in no acute distress. He is alert and oriented x4 and appropriate throughout the examination. HEENT reveals that the patient is normocephalic, atraumatic. EOMs are intact. PERRLA. Skin is intact without any evidence of gross lesions. Cardiovascular exam reveals a regular rate and rhythm, no clicks rubs or murmurs are auscultated. Chest is clear to auscultation bilaterally. Lymphatic assessment is performed and does not reveal any adenopathy in the cervical, supraclavicular, axillary, or inguinal chains. Abdomen has active bowel sounds in all quadrants and is intact. The abdomen is soft, non tender, non distended. Lower extremities are negative for pretibial pitting edema, deep calf tenderness, cyanosis or clubbing.   KPS = 100  100 - Normal; no complaints; no evidence of disease. 90   - Able to carry on normal activity; minor signs or symptoms of disease. 80   - Normal activity with effort; some signs or symptoms of disease. 83   - Cares for self; unable to carry on normal activity or to do active work. 60   - Requires occasional assistance, but is able to care for most of his personal needs. 50   - Requires considerable assistance and frequent medical care. 41   - Disabled; requires special care and assistance. 68   - Severely disabled; hospital admission is indicated although death not  imminent. 61   - Very sick; hospital admission necessary; active supportive treatment necessary. 10   - Moribund; fatal processes progressing rapidly. 0     - Dead  Karnofsky DA, Abelmann [place], Craver LS and Burchenal [facility] [phone]) The use of the nitrogen mustards in the palliative treatment of carcinoma: with particular reference to bronchogenic carcinoma Cancer 1 [phone]  LABORATORY DATA:  Lab Results  Component Value Date   WBC 8.8 10/16/2016   HGB 14.9 10/16/2016   HCT 45.2 10/16/2016   MCV 86.4 10/16/2016   PLT 280 10/16/2016   Lab Results  Component Value Date   NA 136 10/16/2016   K 3.7 10/16/2016   CL 101 10/16/2016  CO2 24 10/16/2016   No results found for: ALT, AST, GGT, ALKPHOS, BILITOT   RADIOGRAPHY: No results found.    IMPRESSION/PLAN: 1. 62 y.o. gentleman with biochemical recurrent Stage pT2c adenocarcinoma of the prostate with Gleason score of 3+4, and a post-prostatectomy rising detectable PSA of 0.13 We discussed the patient's workup and outlines the nature of biochemical recurrent prostate cancer in this setting. Accordingly, he is eligible for external radiation. We discussed the available radiation techniques, and focused on the details and logistics and delivery. We discussed and outlined the risks, benefits, short and long-term effects associated with radiotherapy. His wife notes they will be taking a trip February 2/19-24/19.  At the end of the conversation the patient is interested in moving forward with 8 weeks of external beam therapy. He is scheduled for CT simulation on 06/03/17 at 3 pm. We will share our discussion with Dr. Lovena Neighbours and move forward with treatment planning in anticipation of beginning IMRT in the near future.  We briefly discussed some of the recent evidence around ST-ADT with biochemical recurrence including possible benefits and risks.  We agreed that with his very low PSA, any benefit may be small with significant risk of side effects, and we  decided against it in this instance.  We spent time face to face with the patient and more than 50% of that time was spent in counseling and/or coordination of care.  ------------------------------------------------   Tyler Pita, MD [facility] Director and Director of Stereotactic Radiosurgery Direct Dial: [phone]  Fax: [phone] [email or web address]  Skype  [email or web address]   This document serves as a record of services personally performed by Tyler Pita, MD. It was created on his behalf by Wilburn Mylar, a trained medical scribe. The creation of this record is based on the scribe's personal observations and the provider's statements to them. This document has been checked and approved by the attending provider.

## 2018-04-19 NOTE — Progress Notes (Signed)
See progress note under physician encounter. 

## 2018-04-19 NOTE — Progress Notes (Signed)
Introduced myself to Jim Drake and his wife as the prostate nurse navigator and my role. He states he had prostatectomy 2012 and now has a slowly rising PSA. He would like to move forward with salvage radiation. We discussed the planning session and the radiation treatments. He and his wife have plans for a trip in early February and he will be scheduled after his return. I gave them my business card and asked them to call me with questions or concerns.

## 2018-04-25 DIAGNOSIS — M9903 Segmental and somatic dysfunction of lumbar region: Secondary | ICD-10-CM | POA: Diagnosis not present

## 2018-04-25 DIAGNOSIS — M9901 Segmental and somatic dysfunction of cervical region: Secondary | ICD-10-CM | POA: Diagnosis not present

## 2018-04-25 DIAGNOSIS — M9906 Segmental and somatic dysfunction of lower extremity: Secondary | ICD-10-CM | POA: Diagnosis not present

## 2018-04-25 DIAGNOSIS — M9902 Segmental and somatic dysfunction of thoracic region: Secondary | ICD-10-CM | POA: Diagnosis not present

## 2018-04-27 DIAGNOSIS — M9906 Segmental and somatic dysfunction of lower extremity: Secondary | ICD-10-CM | POA: Diagnosis not present

## 2018-04-27 DIAGNOSIS — M9901 Segmental and somatic dysfunction of cervical region: Secondary | ICD-10-CM | POA: Diagnosis not present

## 2018-04-27 DIAGNOSIS — M9902 Segmental and somatic dysfunction of thoracic region: Secondary | ICD-10-CM | POA: Diagnosis not present

## 2018-04-27 DIAGNOSIS — M9903 Segmental and somatic dysfunction of lumbar region: Secondary | ICD-10-CM | POA: Diagnosis not present

## 2018-05-09 DIAGNOSIS — M9901 Segmental and somatic dysfunction of cervical region: Secondary | ICD-10-CM | POA: Diagnosis not present

## 2018-05-09 DIAGNOSIS — M9906 Segmental and somatic dysfunction of lower extremity: Secondary | ICD-10-CM | POA: Diagnosis not present

## 2018-05-09 DIAGNOSIS — M9903 Segmental and somatic dysfunction of lumbar region: Secondary | ICD-10-CM | POA: Diagnosis not present

## 2018-05-09 DIAGNOSIS — M9902 Segmental and somatic dysfunction of thoracic region: Secondary | ICD-10-CM | POA: Diagnosis not present

## 2018-05-25 NOTE — Progress Notes (Signed)
FMLA successfully faxed to [facility] Specialist at [phone]. Mailed copy to patient address on file.

## 2018-06-03 ENCOUNTER — Ambulatory Visit
Admission: RE | Admit: 2018-06-03 | Discharge: 2018-06-03 | Disposition: A | Discharge status: Home / Self Care | Payer: Federal, State, Local not specified - PPO | Source: Ambulatory Visit | Attending: Radiation Oncology | Admitting: Radiation Oncology

## 2018-06-03 DIAGNOSIS — C61 Malignant neoplasm of prostate: Secondary | ICD-10-CM | POA: Insufficient documentation

## 2018-06-03 DIAGNOSIS — Z510 Encounter for antineoplastic radiation therapy: Secondary | ICD-10-CM | POA: Diagnosis not present

## 2018-06-03 NOTE — Progress Notes (Signed)
  Radiation Oncology         [phone] ________________________________  Name: Jim Drake MRN: [id number]  Date: 06/03/2018  DOB: December 08, 1955  SIMULATION AND TREATMENT PLANNING NOTE    ICD-10-CM   1. Malignant neoplasm of prostate ([place]) C61     DIAGNOSIS:  63 y.o. gentleman with biochemical recurrent Stage pT2c adenocarcinoma of the prostate with Gleason score of 3+4, and a post-prostatectomy rising detectable PSA of 0.13  NARRATIVE:  The patient was brought to the [facility].  Identity was confirmed.  All relevant records and images related to the planned course of therapy were reviewed.  The patient freely provided informed written consent to proceed with treatment after reviewing the details related to the planned course of therapy. The consent form was witnessed and verified by the simulation staff.  Then, the patient was set-up in a stable reproducible supine position for radiation therapy.  A vacuum lock pillow device was custom fabricated to position his legs in a reproducible immobilized position.  Then, I performed a urethrogram under sterile conditions to identify the prostatic apex.  CT images were obtained.  Surface markings were placed.  The CT images were loaded into the planning software.  Then the prostate target and avoidance structures including the rectum, bladder, bowel and hips were contoured.  Treatment planning then occurred.  The radiation prescription was entered and confirmed.  A total of one complex treatment devices was fabricated. I have requested : Intensity Modulated Radiotherapy (IMRT) is medically necessary for this case for the following reason:  Rectal sparing.Marland Kitchen  PLAN:  The patient will receive 64.8 Gy in 38 fractions.  ________________________________  Sheral Apley Tammi Klippel, M.D.  This document serves as a record of services personally performed by Tyler Pita, MD. It was created on his behalf by Wilburn Mylar, a trained medical  scribe. The creation of this record is based on the scribe's personal observations and the provider's statements to them. This document has been checked and approved by the attending provider.

## 2018-06-06 DIAGNOSIS — C61 Malignant neoplasm of prostate: Secondary | ICD-10-CM | POA: Diagnosis not present

## 2018-06-06 DIAGNOSIS — Z510 Encounter for antineoplastic radiation therapy: Secondary | ICD-10-CM | POA: Diagnosis not present

## 2018-06-15 ENCOUNTER — Ambulatory Visit
Admission: RE | Admit: 2018-06-15 | Discharge: 2018-06-15 | Disposition: A | Discharge status: Home / Self Care | Payer: Federal, State, Local not specified - PPO | Source: Ambulatory Visit | Attending: Radiation Oncology | Admitting: Radiation Oncology

## 2018-06-15 DIAGNOSIS — C61 Malignant neoplasm of prostate: Secondary | ICD-10-CM | POA: Diagnosis not present

## 2018-06-15 DIAGNOSIS — Z510 Encounter for antineoplastic radiation therapy: Secondary | ICD-10-CM | POA: Diagnosis not present

## 2018-06-16 ENCOUNTER — Encounter: Payer: Self-pay | Admitting: Medical Oncology

## 2018-06-16 ENCOUNTER — Ambulatory Visit
Admission: RE | Admit: 2018-06-16 | Discharge: 2018-06-16 | Disposition: A | Discharge status: Home / Self Care | Payer: Federal, State, Local not specified - PPO | Source: Ambulatory Visit | Attending: Radiation Oncology | Admitting: Radiation Oncology

## 2018-06-16 DIAGNOSIS — C61 Malignant neoplasm of prostate: Secondary | ICD-10-CM | POA: Diagnosis not present

## 2018-06-16 DIAGNOSIS — Z510 Encounter for antineoplastic radiation therapy: Secondary | ICD-10-CM | POA: Diagnosis not present

## 2018-06-17 ENCOUNTER — Ambulatory Visit
Admission: RE | Admit: 2018-06-17 | Discharge: 2018-06-17 | Disposition: A | Discharge status: Home / Self Care | Payer: Federal, State, Local not specified - PPO | Source: Ambulatory Visit | Attending: Radiation Oncology | Admitting: Radiation Oncology

## 2018-06-17 DIAGNOSIS — C61 Malignant neoplasm of prostate: Secondary | ICD-10-CM | POA: Diagnosis not present

## 2018-06-17 DIAGNOSIS — Z510 Encounter for antineoplastic radiation therapy: Secondary | ICD-10-CM | POA: Diagnosis not present

## 2018-06-17 NOTE — Progress Notes (Signed)
Pt here for patient teaching.  Pt given Radiation and You booklet.  Reviewed areas of pertinence such as diarrhea, fatigue, hair loss and urinary and bladder changes . Pt able to give teach back of use unscented/gentle soap, use baby wipes, have Imodium on hand, drink plenty of water and sitz bath,. Pt demonstrated understanding,  of information given and will contact nursing with any questions or concerns.     Http://rtanswers.org/treatmentinformation/whattoexpect/index

## 2018-06-20 ENCOUNTER — Ambulatory Visit
Admission: RE | Admit: 2018-06-20 | Discharge: 2018-06-20 | Disposition: A | Discharge status: Home / Self Care | Payer: Federal, State, Local not specified - PPO | Source: Ambulatory Visit | Attending: Radiation Oncology | Admitting: Radiation Oncology

## 2018-06-20 DIAGNOSIS — Z510 Encounter for antineoplastic radiation therapy: Secondary | ICD-10-CM | POA: Insufficient documentation

## 2018-06-20 DIAGNOSIS — C61 Malignant neoplasm of prostate: Secondary | ICD-10-CM | POA: Diagnosis not present

## 2018-06-21 ENCOUNTER — Ambulatory Visit
Admission: RE | Admit: 2018-06-21 | Discharge: 2018-06-21 | Disposition: A | Discharge status: Home / Self Care | Payer: Federal, State, Local not specified - PPO | Source: Ambulatory Visit | Attending: Radiation Oncology | Admitting: Radiation Oncology

## 2018-06-21 DIAGNOSIS — Z510 Encounter for antineoplastic radiation therapy: Secondary | ICD-10-CM | POA: Diagnosis not present

## 2018-06-21 DIAGNOSIS — C61 Malignant neoplasm of prostate: Secondary | ICD-10-CM | POA: Diagnosis not present

## 2018-06-22 ENCOUNTER — Ambulatory Visit
Admission: RE | Admit: 2018-06-22 | Discharge: 2018-06-22 | Disposition: A | Discharge status: Home / Self Care | Payer: Federal, State, Local not specified - PPO | Source: Ambulatory Visit | Attending: Radiation Oncology | Admitting: Radiation Oncology

## 2018-06-22 DIAGNOSIS — C61 Malignant neoplasm of prostate: Secondary | ICD-10-CM | POA: Diagnosis not present

## 2018-06-22 DIAGNOSIS — Z510 Encounter for antineoplastic radiation therapy: Secondary | ICD-10-CM | POA: Diagnosis not present

## 2018-06-23 ENCOUNTER — Ambulatory Visit
Admission: RE | Admit: 2018-06-23 | Discharge: 2018-06-23 | Disposition: A | Discharge status: Home / Self Care | Payer: Federal, State, Local not specified - PPO | Source: Ambulatory Visit | Attending: Radiation Oncology | Admitting: Radiation Oncology

## 2018-06-23 DIAGNOSIS — C61 Malignant neoplasm of prostate: Secondary | ICD-10-CM | POA: Diagnosis not present

## 2018-06-23 DIAGNOSIS — Z510 Encounter for antineoplastic radiation therapy: Secondary | ICD-10-CM | POA: Diagnosis not present

## 2018-06-24 ENCOUNTER — Ambulatory Visit
Admission: RE | Admit: 2018-06-24 | Discharge: 2018-06-24 | Disposition: A | Discharge status: Home / Self Care | Payer: Federal, State, Local not specified - PPO | Source: Ambulatory Visit | Attending: Radiation Oncology | Admitting: Radiation Oncology

## 2018-06-24 DIAGNOSIS — C61 Malignant neoplasm of prostate: Secondary | ICD-10-CM | POA: Diagnosis not present

## 2018-06-24 DIAGNOSIS — Z510 Encounter for antineoplastic radiation therapy: Secondary | ICD-10-CM | POA: Diagnosis not present

## 2018-06-27 ENCOUNTER — Ambulatory Visit
Admission: RE | Admit: 2018-06-27 | Discharge: 2018-06-27 | Disposition: A | Discharge status: Home / Self Care | Payer: Federal, State, Local not specified - PPO | Source: Ambulatory Visit | Attending: Radiation Oncology | Admitting: Radiation Oncology

## 2018-06-27 DIAGNOSIS — C61 Malignant neoplasm of prostate: Secondary | ICD-10-CM | POA: Diagnosis not present

## 2018-06-27 DIAGNOSIS — Z510 Encounter for antineoplastic radiation therapy: Secondary | ICD-10-CM | POA: Diagnosis not present

## 2018-06-27 NOTE — Progress Notes (Signed)
Jim Drake states this is day 2 of radiation. He states treatments are going well without complaints or concerns.

## 2018-06-28 ENCOUNTER — Ambulatory Visit
Admission: RE | Admit: 2018-06-28 | Discharge: 2018-06-28 | Disposition: A | Discharge status: Home / Self Care | Payer: Federal, State, Local not specified - PPO | Source: Ambulatory Visit | Attending: Radiation Oncology | Admitting: Radiation Oncology

## 2018-06-28 DIAGNOSIS — C61 Malignant neoplasm of prostate: Secondary | ICD-10-CM | POA: Diagnosis not present

## 2018-06-28 DIAGNOSIS — Z510 Encounter for antineoplastic radiation therapy: Secondary | ICD-10-CM | POA: Diagnosis not present

## 2018-06-29 ENCOUNTER — Other Ambulatory Visit: Payer: Self-pay

## 2018-06-29 ENCOUNTER — Ambulatory Visit
Admission: RE | Admit: 2018-06-29 | Discharge: 2018-06-29 | Disposition: A | Discharge status: Home / Self Care | Payer: Federal, State, Local not specified - PPO | Source: Ambulatory Visit | Attending: Radiation Oncology | Admitting: Radiation Oncology

## 2018-06-29 DIAGNOSIS — C61 Malignant neoplasm of prostate: Secondary | ICD-10-CM | POA: Diagnosis not present

## 2018-06-29 DIAGNOSIS — Z510 Encounter for antineoplastic radiation therapy: Secondary | ICD-10-CM | POA: Diagnosis not present

## 2018-06-30 ENCOUNTER — Ambulatory Visit
Admission: RE | Admit: 2018-06-30 | Discharge: 2018-06-30 | Disposition: A | Discharge status: Home / Self Care | Payer: Federal, State, Local not specified - PPO | Source: Ambulatory Visit | Attending: Radiation Oncology | Admitting: Radiation Oncology

## 2018-06-30 DIAGNOSIS — Z510 Encounter for antineoplastic radiation therapy: Secondary | ICD-10-CM | POA: Diagnosis not present

## 2018-06-30 DIAGNOSIS — C61 Malignant neoplasm of prostate: Secondary | ICD-10-CM | POA: Diagnosis not present

## 2018-07-01 ENCOUNTER — Ambulatory Visit
Admission: RE | Admit: 2018-07-01 | Discharge: 2018-07-01 | Disposition: A | Discharge status: Home / Self Care | Payer: Federal, State, Local not specified - PPO | Source: Ambulatory Visit | Attending: Radiation Oncology | Admitting: Radiation Oncology

## 2018-07-01 DIAGNOSIS — C61 Malignant neoplasm of prostate: Secondary | ICD-10-CM | POA: Diagnosis not present

## 2018-07-01 DIAGNOSIS — Z510 Encounter for antineoplastic radiation therapy: Secondary | ICD-10-CM | POA: Diagnosis not present

## 2018-07-04 ENCOUNTER — Other Ambulatory Visit: Payer: Self-pay

## 2018-07-04 ENCOUNTER — Ambulatory Visit
Admission: RE | Admit: 2018-07-04 | Discharge: 2018-07-04 | Disposition: A | Discharge status: Home / Self Care | Payer: Federal, State, Local not specified - PPO | Source: Ambulatory Visit | Attending: Radiation Oncology | Admitting: Radiation Oncology

## 2018-07-04 DIAGNOSIS — Z510 Encounter for antineoplastic radiation therapy: Secondary | ICD-10-CM | POA: Diagnosis not present

## 2018-07-04 DIAGNOSIS — C61 Malignant neoplasm of prostate: Secondary | ICD-10-CM | POA: Diagnosis not present

## 2018-07-05 ENCOUNTER — Ambulatory Visit: Payer: Federal, State, Local not specified - PPO

## 2018-07-06 ENCOUNTER — Other Ambulatory Visit: Payer: Self-pay

## 2018-07-06 ENCOUNTER — Ambulatory Visit
Admission: RE | Admit: 2018-07-06 | Discharge: 2018-07-06 | Disposition: A | Discharge status: Home / Self Care | Payer: Federal, State, Local not specified - PPO | Source: Ambulatory Visit | Attending: Radiation Oncology | Admitting: Radiation Oncology

## 2018-07-06 DIAGNOSIS — C61 Malignant neoplasm of prostate: Secondary | ICD-10-CM | POA: Diagnosis not present

## 2018-07-06 DIAGNOSIS — Z510 Encounter for antineoplastic radiation therapy: Secondary | ICD-10-CM | POA: Diagnosis not present

## 2018-07-07 ENCOUNTER — Ambulatory Visit
Admission: RE | Admit: 2018-07-07 | Discharge: 2018-07-07 | Disposition: A | Discharge status: Home / Self Care | Payer: Federal, State, Local not specified - PPO | Source: Ambulatory Visit | Attending: Radiation Oncology | Admitting: Radiation Oncology

## 2018-07-07 ENCOUNTER — Other Ambulatory Visit: Payer: Self-pay

## 2018-07-07 DIAGNOSIS — Z510 Encounter for antineoplastic radiation therapy: Secondary | ICD-10-CM | POA: Diagnosis not present

## 2018-07-07 DIAGNOSIS — C61 Malignant neoplasm of prostate: Secondary | ICD-10-CM | POA: Diagnosis not present

## 2018-07-08 ENCOUNTER — Other Ambulatory Visit: Payer: Self-pay

## 2018-07-08 ENCOUNTER — Ambulatory Visit
Admission: RE | Admit: 2018-07-08 | Discharge: 2018-07-08 | Disposition: A | Discharge status: Home / Self Care | Payer: Federal, State, Local not specified - PPO | Source: Ambulatory Visit | Attending: Radiation Oncology | Admitting: Radiation Oncology

## 2018-07-08 DIAGNOSIS — Z510 Encounter for antineoplastic radiation therapy: Secondary | ICD-10-CM | POA: Diagnosis not present

## 2018-07-08 DIAGNOSIS — C61 Malignant neoplasm of prostate: Secondary | ICD-10-CM | POA: Diagnosis not present

## 2018-07-11 ENCOUNTER — Other Ambulatory Visit: Payer: Self-pay

## 2018-07-11 ENCOUNTER — Ambulatory Visit
Admission: RE | Admit: 2018-07-11 | Discharge: 2018-07-11 | Disposition: A | Discharge status: Home / Self Care | Payer: Federal, State, Local not specified - PPO | Source: Ambulatory Visit | Attending: Radiation Oncology | Admitting: Radiation Oncology

## 2018-07-11 DIAGNOSIS — C61 Malignant neoplasm of prostate: Secondary | ICD-10-CM | POA: Diagnosis not present

## 2018-07-11 DIAGNOSIS — Z510 Encounter for antineoplastic radiation therapy: Secondary | ICD-10-CM | POA: Diagnosis not present

## 2018-07-12 ENCOUNTER — Ambulatory Visit
Admission: RE | Admit: 2018-07-12 | Discharge: 2018-07-12 | Disposition: A | Discharge status: Home / Self Care | Payer: Federal, State, Local not specified - PPO | Source: Ambulatory Visit | Attending: Radiation Oncology | Admitting: Radiation Oncology

## 2018-07-12 ENCOUNTER — Other Ambulatory Visit: Payer: Self-pay

## 2018-07-12 DIAGNOSIS — Z510 Encounter for antineoplastic radiation therapy: Secondary | ICD-10-CM | POA: Diagnosis not present

## 2018-07-12 DIAGNOSIS — C61 Malignant neoplasm of prostate: Secondary | ICD-10-CM | POA: Diagnosis not present

## 2018-07-13 ENCOUNTER — Ambulatory Visit
Admission: RE | Admit: 2018-07-13 | Discharge: 2018-07-13 | Disposition: A | Discharge status: Home / Self Care | Payer: Federal, State, Local not specified - PPO | Source: Ambulatory Visit | Attending: Radiation Oncology | Admitting: Radiation Oncology

## 2018-07-13 ENCOUNTER — Other Ambulatory Visit: Payer: Self-pay

## 2018-07-13 DIAGNOSIS — Z510 Encounter for antineoplastic radiation therapy: Secondary | ICD-10-CM | POA: Diagnosis not present

## 2018-07-13 DIAGNOSIS — C61 Malignant neoplasm of prostate: Secondary | ICD-10-CM | POA: Diagnosis not present

## 2018-07-14 ENCOUNTER — Ambulatory Visit
Admission: RE | Admit: 2018-07-14 | Discharge: 2018-07-14 | Disposition: A | Discharge status: Home / Self Care | Payer: Federal, State, Local not specified - PPO | Source: Ambulatory Visit | Attending: Radiation Oncology | Admitting: Radiation Oncology

## 2018-07-14 ENCOUNTER — Other Ambulatory Visit: Payer: Self-pay

## 2018-07-14 DIAGNOSIS — C61 Malignant neoplasm of prostate: Secondary | ICD-10-CM | POA: Diagnosis not present

## 2018-07-14 DIAGNOSIS — Z510 Encounter for antineoplastic radiation therapy: Secondary | ICD-10-CM | POA: Diagnosis not present

## 2018-07-15 ENCOUNTER — Other Ambulatory Visit: Payer: Self-pay

## 2018-07-15 ENCOUNTER — Ambulatory Visit
Admission: RE | Admit: 2018-07-15 | Discharge: 2018-07-15 | Disposition: A | Discharge status: Home / Self Care | Payer: Federal, State, Local not specified - PPO | Source: Ambulatory Visit | Attending: Radiation Oncology | Admitting: Radiation Oncology

## 2018-07-15 DIAGNOSIS — C61 Malignant neoplasm of prostate: Secondary | ICD-10-CM | POA: Diagnosis not present

## 2018-07-15 DIAGNOSIS — Z510 Encounter for antineoplastic radiation therapy: Secondary | ICD-10-CM | POA: Diagnosis not present

## 2018-07-18 ENCOUNTER — Ambulatory Visit
Admission: RE | Admit: 2018-07-18 | Discharge: 2018-07-18 | Disposition: A | Discharge status: Home / Self Care | Payer: Federal, State, Local not specified - PPO | Source: Ambulatory Visit | Attending: Radiation Oncology | Admitting: Radiation Oncology

## 2018-07-18 ENCOUNTER — Other Ambulatory Visit: Payer: Self-pay

## 2018-07-18 DIAGNOSIS — Z510 Encounter for antineoplastic radiation therapy: Secondary | ICD-10-CM | POA: Diagnosis not present

## 2018-07-18 DIAGNOSIS — C61 Malignant neoplasm of prostate: Secondary | ICD-10-CM | POA: Diagnosis not present

## 2018-07-19 ENCOUNTER — Other Ambulatory Visit: Payer: Self-pay

## 2018-07-19 ENCOUNTER — Ambulatory Visit
Admission: RE | Admit: 2018-07-19 | Discharge: 2018-07-19 | Disposition: A | Discharge status: Home / Self Care | Payer: Federal, State, Local not specified - PPO | Source: Ambulatory Visit | Attending: Radiation Oncology | Admitting: Radiation Oncology

## 2018-07-19 DIAGNOSIS — Z510 Encounter for antineoplastic radiation therapy: Secondary | ICD-10-CM | POA: Diagnosis not present

## 2018-07-19 DIAGNOSIS — C61 Malignant neoplasm of prostate: Secondary | ICD-10-CM | POA: Diagnosis not present

## 2018-07-20 ENCOUNTER — Ambulatory Visit
Admission: RE | Admit: 2018-07-20 | Discharge: 2018-07-20 | Disposition: A | Discharge status: Home / Self Care | Payer: Federal, State, Local not specified - PPO | Source: Ambulatory Visit | Attending: Radiation Oncology | Admitting: Radiation Oncology

## 2018-07-20 ENCOUNTER — Other Ambulatory Visit: Payer: Self-pay

## 2018-07-20 DIAGNOSIS — Z510 Encounter for antineoplastic radiation therapy: Secondary | ICD-10-CM | POA: Diagnosis not present

## 2018-07-20 DIAGNOSIS — C61 Malignant neoplasm of prostate: Secondary | ICD-10-CM | POA: Diagnosis not present

## 2018-07-21 ENCOUNTER — Ambulatory Visit
Admission: RE | Admit: 2018-07-21 | Discharge: 2018-07-21 | Disposition: A | Discharge status: Home / Self Care | Payer: Federal, State, Local not specified - PPO | Source: Ambulatory Visit | Attending: Radiation Oncology | Admitting: Radiation Oncology

## 2018-07-21 ENCOUNTER — Other Ambulatory Visit: Payer: Self-pay

## 2018-07-21 DIAGNOSIS — C61 Malignant neoplasm of prostate: Secondary | ICD-10-CM | POA: Diagnosis not present

## 2018-07-21 DIAGNOSIS — Z510 Encounter for antineoplastic radiation therapy: Secondary | ICD-10-CM | POA: Diagnosis not present

## 2018-07-22 ENCOUNTER — Ambulatory Visit
Admission: RE | Admit: 2018-07-22 | Discharge: 2018-07-22 | Disposition: A | Discharge status: Home / Self Care | Payer: Federal, State, Local not specified - PPO | Source: Ambulatory Visit | Attending: Radiation Oncology | Admitting: Radiation Oncology

## 2018-07-22 ENCOUNTER — Other Ambulatory Visit: Payer: Self-pay

## 2018-07-22 DIAGNOSIS — Z510 Encounter for antineoplastic radiation therapy: Secondary | ICD-10-CM | POA: Diagnosis not present

## 2018-07-22 DIAGNOSIS — C61 Malignant neoplasm of prostate: Secondary | ICD-10-CM | POA: Diagnosis not present

## 2018-07-25 ENCOUNTER — Ambulatory Visit: Admission: RE | Admit: 2018-07-25 | Payer: Federal, State, Local not specified - PPO | Source: Ambulatory Visit

## 2018-07-25 ENCOUNTER — Other Ambulatory Visit: Payer: Self-pay

## 2018-07-26 ENCOUNTER — Ambulatory Visit
Admission: RE | Admit: 2018-07-26 | Discharge: 2018-07-26 | Disposition: A | Discharge status: Home / Self Care | Payer: Federal, State, Local not specified - PPO | Source: Ambulatory Visit | Attending: Radiation Oncology | Admitting: Radiation Oncology

## 2018-07-26 ENCOUNTER — Other Ambulatory Visit: Payer: Self-pay

## 2018-07-26 DIAGNOSIS — C61 Malignant neoplasm of prostate: Secondary | ICD-10-CM | POA: Diagnosis not present

## 2018-07-26 DIAGNOSIS — Z510 Encounter for antineoplastic radiation therapy: Secondary | ICD-10-CM | POA: Diagnosis not present

## 2018-07-27 ENCOUNTER — Ambulatory Visit
Admission: RE | Admit: 2018-07-27 | Discharge: 2018-07-27 | Disposition: A | Discharge status: Home / Self Care | Payer: Federal, State, Local not specified - PPO | Source: Ambulatory Visit | Attending: Radiation Oncology | Admitting: Radiation Oncology

## 2018-07-27 ENCOUNTER — Other Ambulatory Visit: Payer: Self-pay

## 2018-07-27 DIAGNOSIS — C61 Malignant neoplasm of prostate: Secondary | ICD-10-CM | POA: Diagnosis not present

## 2018-07-27 DIAGNOSIS — Z510 Encounter for antineoplastic radiation therapy: Secondary | ICD-10-CM | POA: Diagnosis not present

## 2018-07-28 ENCOUNTER — Other Ambulatory Visit: Payer: Self-pay

## 2018-07-28 ENCOUNTER — Ambulatory Visit
Admission: RE | Admit: 2018-07-28 | Discharge: 2018-07-28 | Disposition: A | Discharge status: Home / Self Care | Payer: Federal, State, Local not specified - PPO | Source: Ambulatory Visit | Attending: Radiation Oncology | Admitting: Radiation Oncology

## 2018-07-28 DIAGNOSIS — Z510 Encounter for antineoplastic radiation therapy: Secondary | ICD-10-CM | POA: Diagnosis not present

## 2018-07-28 DIAGNOSIS — C61 Malignant neoplasm of prostate: Secondary | ICD-10-CM | POA: Diagnosis not present

## 2018-07-29 ENCOUNTER — Ambulatory Visit
Admission: RE | Admit: 2018-07-29 | Discharge: 2018-07-29 | Disposition: A | Discharge status: Home / Self Care | Payer: Federal, State, Local not specified - PPO | Source: Ambulatory Visit | Attending: Radiation Oncology | Admitting: Radiation Oncology

## 2018-07-29 ENCOUNTER — Other Ambulatory Visit: Payer: Self-pay

## 2018-07-29 DIAGNOSIS — C61 Malignant neoplasm of prostate: Secondary | ICD-10-CM | POA: Diagnosis not present

## 2018-07-29 DIAGNOSIS — Z510 Encounter for antineoplastic radiation therapy: Secondary | ICD-10-CM | POA: Diagnosis not present

## 2018-08-01 ENCOUNTER — Ambulatory Visit
Admission: RE | Admit: 2018-08-01 | Discharge: 2018-08-01 | Disposition: A | Discharge status: Home / Self Care | Payer: Federal, State, Local not specified - PPO | Source: Ambulatory Visit | Attending: Radiation Oncology | Admitting: Radiation Oncology

## 2018-08-01 ENCOUNTER — Other Ambulatory Visit: Payer: Self-pay

## 2018-08-01 DIAGNOSIS — Z510 Encounter for antineoplastic radiation therapy: Secondary | ICD-10-CM | POA: Diagnosis not present

## 2018-08-01 DIAGNOSIS — C61 Malignant neoplasm of prostate: Secondary | ICD-10-CM | POA: Diagnosis not present

## 2018-08-02 ENCOUNTER — Ambulatory Visit
Admission: RE | Admit: 2018-08-02 | Discharge: 2018-08-02 | Disposition: A | Discharge status: Home / Self Care | Payer: Federal, State, Local not specified - PPO | Source: Ambulatory Visit | Attending: Radiation Oncology | Admitting: Radiation Oncology

## 2018-08-02 ENCOUNTER — Other Ambulatory Visit: Payer: Self-pay

## 2018-08-02 DIAGNOSIS — C61 Malignant neoplasm of prostate: Secondary | ICD-10-CM | POA: Diagnosis not present

## 2018-08-02 DIAGNOSIS — Z510 Encounter for antineoplastic radiation therapy: Secondary | ICD-10-CM | POA: Diagnosis not present

## 2018-08-03 ENCOUNTER — Ambulatory Visit
Admission: RE | Admit: 2018-08-03 | Discharge: 2018-08-03 | Disposition: A | Discharge status: Home / Self Care | Payer: Federal, State, Local not specified - PPO | Source: Ambulatory Visit | Attending: Radiation Oncology | Admitting: Radiation Oncology

## 2018-08-03 DIAGNOSIS — Z510 Encounter for antineoplastic radiation therapy: Secondary | ICD-10-CM | POA: Diagnosis not present

## 2018-08-03 DIAGNOSIS — C61 Malignant neoplasm of prostate: Secondary | ICD-10-CM | POA: Diagnosis not present

## 2018-08-04 ENCOUNTER — Ambulatory Visit
Admission: RE | Admit: 2018-08-04 | Discharge: 2018-08-04 | Disposition: A | Discharge status: Home / Self Care | Payer: Federal, State, Local not specified - PPO | Source: Ambulatory Visit | Attending: Radiation Oncology | Admitting: Radiation Oncology

## 2018-08-04 ENCOUNTER — Other Ambulatory Visit: Payer: Self-pay

## 2018-08-04 DIAGNOSIS — Z510 Encounter for antineoplastic radiation therapy: Secondary | ICD-10-CM | POA: Diagnosis not present

## 2018-08-04 DIAGNOSIS — C61 Malignant neoplasm of prostate: Secondary | ICD-10-CM | POA: Diagnosis not present

## 2018-08-05 ENCOUNTER — Ambulatory Visit
Admission: RE | Admit: 2018-08-05 | Discharge: 2018-08-05 | Disposition: A | Discharge status: Home / Self Care | Payer: Federal, State, Local not specified - PPO | Source: Ambulatory Visit | Attending: Radiation Oncology | Admitting: Radiation Oncology

## 2018-08-05 ENCOUNTER — Other Ambulatory Visit: Payer: Self-pay

## 2018-08-05 ENCOUNTER — Ambulatory Visit: Payer: Federal, State, Local not specified - PPO

## 2018-08-05 DIAGNOSIS — C61 Malignant neoplasm of prostate: Secondary | ICD-10-CM | POA: Diagnosis not present

## 2018-08-05 DIAGNOSIS — Z510 Encounter for antineoplastic radiation therapy: Secondary | ICD-10-CM | POA: Diagnosis not present

## 2018-08-08 ENCOUNTER — Other Ambulatory Visit: Payer: Self-pay

## 2018-08-08 ENCOUNTER — Ambulatory Visit: Payer: Federal, State, Local not specified - PPO

## 2018-08-08 ENCOUNTER — Ambulatory Visit
Admission: RE | Admit: 2018-08-08 | Discharge: 2018-08-08 | Disposition: A | Discharge status: Home / Self Care | Payer: Federal, State, Local not specified - PPO | Source: Ambulatory Visit | Attending: Radiation Oncology | Admitting: Radiation Oncology

## 2018-08-08 DIAGNOSIS — C61 Malignant neoplasm of prostate: Secondary | ICD-10-CM | POA: Diagnosis not present

## 2018-08-08 DIAGNOSIS — Z510 Encounter for antineoplastic radiation therapy: Secondary | ICD-10-CM | POA: Diagnosis not present

## 2018-08-09 ENCOUNTER — Other Ambulatory Visit: Payer: Self-pay

## 2018-08-09 ENCOUNTER — Ambulatory Visit
Admission: RE | Admit: 2018-08-09 | Discharge: 2018-08-09 | Disposition: A | Discharge status: Home / Self Care | Payer: Federal, State, Local not specified - PPO | Source: Ambulatory Visit | Attending: Radiation Oncology | Admitting: Radiation Oncology

## 2018-08-09 ENCOUNTER — Encounter: Payer: Self-pay | Admitting: Radiation Oncology

## 2018-08-09 DIAGNOSIS — C61 Malignant neoplasm of prostate: Secondary | ICD-10-CM | POA: Diagnosis not present

## 2018-08-09 DIAGNOSIS — Z510 Encounter for antineoplastic radiation therapy: Secondary | ICD-10-CM | POA: Diagnosis not present

## 2018-08-16 NOTE — Progress Notes (Signed)
  Radiation Oncology         [phone]) [phone] ________________________________  Name: Jim Drake MRN: [id number]  Date: 08/09/2018  DOB: [birth year]  End of Treatment Note  Diagnosis:   63 y.o. male with biochemical recurrence ofStagepT2cadenocarcinoma of the prostate with Gleason score of 3+4, anda post-prostatectomy rising detectablePSA of0.13  Indication for treatment:  Curative, Salvage Prostatic Fossa Radiotherapy       Radiation treatment dates:   06/15/2018 - 08/09/2018  Site/dose:   The prostatic fossa was treated to 68.4 Gy in 38 fractions of 1.8 Gy  Beams/energy:   The prostatic fossa was treated using helical intensity modulated radiotherapy delivering 6 megavolt photons. Image guidance was performed with megavoltage CT studies prior to each fraction. He was immobilized with a body fix lower extremity mold.  Narrative: The patient tolerated radiation treatment relatively well. He experienced some minor urinary irritation with increased urgency, occasional leakage, nocturia x1, and mild dysuria. He denied hematuria or significant fatigue. He did develop some constipation which was managed with the addition of fiber to his diet to help alleviate his constipation.   Plan: The patient has completed radiation treatment. He will return to radiation oncology clinic for routine followup in one month. I advised him to call or return sooner if he has any questions or concerns related to his recovery or treatment. ________________________________  Sheral Apley. Tammi Klippel, M.D.  This document serves as a record of services personally performed by Tyler Pita, MD. It was created on his behalf by Rae Lips, a trained medical scribe. The creation of this record is based on the scribe's personal observations and the provider's statements to them. This document has been checked and approved by the attending provider.

## 2018-10-04 ENCOUNTER — Telehealth: Payer: Self-pay | Admitting: Radiation Oncology

## 2018-10-04 DIAGNOSIS — R9721 Rising PSA following treatment for malignant neoplasm of prostate: Secondary | ICD-10-CM | POA: Diagnosis not present

## 2018-10-04 DIAGNOSIS — N5231 Erectile dysfunction following radical prostatectomy: Secondary | ICD-10-CM | POA: Diagnosis not present

## 2018-10-04 DIAGNOSIS — C61 Malignant neoplasm of prostate: Secondary | ICD-10-CM | POA: Diagnosis not present

## 2018-10-04 NOTE — Telephone Encounter (Signed)
Got message that patient wanted me to call. I called him.  He has rectal irritation externally. Recommended OTC A&D ointment.

## 2018-10-07 ENCOUNTER — Encounter: Payer: Self-pay | Admitting: *Deleted

## 2018-10-10 DIAGNOSIS — M9902 Segmental and somatic dysfunction of thoracic region: Secondary | ICD-10-CM | POA: Diagnosis not present

## 2018-10-10 DIAGNOSIS — M9901 Segmental and somatic dysfunction of cervical region: Secondary | ICD-10-CM | POA: Diagnosis not present

## 2018-10-10 DIAGNOSIS — M9904 Segmental and somatic dysfunction of sacral region: Secondary | ICD-10-CM | POA: Diagnosis not present

## 2018-10-10 DIAGNOSIS — M530 Cervicocranial syndrome: Secondary | ICD-10-CM | POA: Diagnosis not present

## 2018-10-12 DIAGNOSIS — M9901 Segmental and somatic dysfunction of cervical region: Secondary | ICD-10-CM | POA: Diagnosis not present

## 2018-10-12 DIAGNOSIS — M530 Cervicocranial syndrome: Secondary | ICD-10-CM | POA: Diagnosis not present

## 2018-10-12 DIAGNOSIS — M9904 Segmental and somatic dysfunction of sacral region: Secondary | ICD-10-CM | POA: Diagnosis not present

## 2018-10-12 DIAGNOSIS — M9902 Segmental and somatic dysfunction of thoracic region: Secondary | ICD-10-CM | POA: Diagnosis not present

## 2018-10-17 DIAGNOSIS — M530 Cervicocranial syndrome: Secondary | ICD-10-CM | POA: Diagnosis not present

## 2018-10-17 DIAGNOSIS — M9904 Segmental and somatic dysfunction of sacral region: Secondary | ICD-10-CM | POA: Diagnosis not present

## 2018-10-17 DIAGNOSIS — M9901 Segmental and somatic dysfunction of cervical region: Secondary | ICD-10-CM | POA: Diagnosis not present

## 2018-10-17 DIAGNOSIS — M9902 Segmental and somatic dysfunction of thoracic region: Secondary | ICD-10-CM | POA: Diagnosis not present

## 2018-10-19 DIAGNOSIS — M9904 Segmental and somatic dysfunction of sacral region: Secondary | ICD-10-CM | POA: Diagnosis not present

## 2018-10-19 DIAGNOSIS — M9902 Segmental and somatic dysfunction of thoracic region: Secondary | ICD-10-CM | POA: Diagnosis not present

## 2018-10-19 DIAGNOSIS — M530 Cervicocranial syndrome: Secondary | ICD-10-CM | POA: Diagnosis not present

## 2018-10-19 DIAGNOSIS — M9901 Segmental and somatic dysfunction of cervical region: Secondary | ICD-10-CM | POA: Diagnosis not present

## 2018-10-24 DIAGNOSIS — M9904 Segmental and somatic dysfunction of sacral region: Secondary | ICD-10-CM | POA: Diagnosis not present

## 2018-10-24 DIAGNOSIS — M530 Cervicocranial syndrome: Secondary | ICD-10-CM | POA: Diagnosis not present

## 2018-10-24 DIAGNOSIS — M9901 Segmental and somatic dysfunction of cervical region: Secondary | ICD-10-CM | POA: Diagnosis not present

## 2018-10-24 DIAGNOSIS — M9902 Segmental and somatic dysfunction of thoracic region: Secondary | ICD-10-CM | POA: Diagnosis not present

## 2018-10-26 DIAGNOSIS — M9901 Segmental and somatic dysfunction of cervical region: Secondary | ICD-10-CM | POA: Diagnosis not present

## 2018-10-26 DIAGNOSIS — M9904 Segmental and somatic dysfunction of sacral region: Secondary | ICD-10-CM | POA: Diagnosis not present

## 2018-10-26 DIAGNOSIS — M530 Cervicocranial syndrome: Secondary | ICD-10-CM | POA: Diagnosis not present

## 2018-10-26 DIAGNOSIS — M9902 Segmental and somatic dysfunction of thoracic region: Secondary | ICD-10-CM | POA: Diagnosis not present

## 2018-10-31 DIAGNOSIS — M530 Cervicocranial syndrome: Secondary | ICD-10-CM | POA: Diagnosis not present

## 2018-10-31 DIAGNOSIS — M9901 Segmental and somatic dysfunction of cervical region: Secondary | ICD-10-CM | POA: Diagnosis not present

## 2018-10-31 DIAGNOSIS — M9902 Segmental and somatic dysfunction of thoracic region: Secondary | ICD-10-CM | POA: Diagnosis not present

## 2018-10-31 DIAGNOSIS — M9904 Segmental and somatic dysfunction of sacral region: Secondary | ICD-10-CM | POA: Diagnosis not present

## 2018-11-08 DIAGNOSIS — M9901 Segmental and somatic dysfunction of cervical region: Secondary | ICD-10-CM | POA: Diagnosis not present

## 2018-11-08 DIAGNOSIS — M9904 Segmental and somatic dysfunction of sacral region: Secondary | ICD-10-CM | POA: Diagnosis not present

## 2018-11-08 DIAGNOSIS — M530 Cervicocranial syndrome: Secondary | ICD-10-CM | POA: Diagnosis not present

## 2018-11-08 DIAGNOSIS — M9902 Segmental and somatic dysfunction of thoracic region: Secondary | ICD-10-CM | POA: Diagnosis not present

## 2018-11-16 DIAGNOSIS — M9902 Segmental and somatic dysfunction of thoracic region: Secondary | ICD-10-CM | POA: Diagnosis not present

## 2018-11-16 DIAGNOSIS — M9901 Segmental and somatic dysfunction of cervical region: Secondary | ICD-10-CM | POA: Diagnosis not present

## 2018-11-16 DIAGNOSIS — M530 Cervicocranial syndrome: Secondary | ICD-10-CM | POA: Diagnosis not present

## 2018-11-16 DIAGNOSIS — M9904 Segmental and somatic dysfunction of sacral region: Secondary | ICD-10-CM | POA: Diagnosis not present

## 2018-11-29 DIAGNOSIS — M9904 Segmental and somatic dysfunction of sacral region: Secondary | ICD-10-CM | POA: Diagnosis not present

## 2018-11-29 DIAGNOSIS — M530 Cervicocranial syndrome: Secondary | ICD-10-CM | POA: Diagnosis not present

## 2018-11-29 DIAGNOSIS — M9902 Segmental and somatic dysfunction of thoracic region: Secondary | ICD-10-CM | POA: Diagnosis not present

## 2018-11-29 DIAGNOSIS — M9901 Segmental and somatic dysfunction of cervical region: Secondary | ICD-10-CM | POA: Diagnosis not present

## 2018-12-07 DIAGNOSIS — M9901 Segmental and somatic dysfunction of cervical region: Secondary | ICD-10-CM | POA: Diagnosis not present

## 2018-12-07 DIAGNOSIS — M9902 Segmental and somatic dysfunction of thoracic region: Secondary | ICD-10-CM | POA: Diagnosis not present

## 2018-12-07 DIAGNOSIS — M9904 Segmental and somatic dysfunction of sacral region: Secondary | ICD-10-CM | POA: Diagnosis not present

## 2018-12-07 DIAGNOSIS — M530 Cervicocranial syndrome: Secondary | ICD-10-CM | POA: Diagnosis not present

## 2018-12-12 DIAGNOSIS — M9901 Segmental and somatic dysfunction of cervical region: Secondary | ICD-10-CM | POA: Diagnosis not present

## 2018-12-12 DIAGNOSIS — M9902 Segmental and somatic dysfunction of thoracic region: Secondary | ICD-10-CM | POA: Diagnosis not present

## 2018-12-12 DIAGNOSIS — M9904 Segmental and somatic dysfunction of sacral region: Secondary | ICD-10-CM | POA: Diagnosis not present

## 2018-12-12 DIAGNOSIS — M530 Cervicocranial syndrome: Secondary | ICD-10-CM | POA: Diagnosis not present

## 2018-12-14 DIAGNOSIS — M9901 Segmental and somatic dysfunction of cervical region: Secondary | ICD-10-CM | POA: Diagnosis not present

## 2018-12-14 DIAGNOSIS — M530 Cervicocranial syndrome: Secondary | ICD-10-CM | POA: Diagnosis not present

## 2018-12-14 DIAGNOSIS — M9904 Segmental and somatic dysfunction of sacral region: Secondary | ICD-10-CM | POA: Diagnosis not present

## 2018-12-14 DIAGNOSIS — M9902 Segmental and somatic dysfunction of thoracic region: Secondary | ICD-10-CM | POA: Diagnosis not present

## 2019-01-03 DIAGNOSIS — C61 Malignant neoplasm of prostate: Secondary | ICD-10-CM | POA: Diagnosis not present

## 2019-01-10 ENCOUNTER — Encounter: Payer: Self-pay | Admitting: *Deleted

## 2019-02-01 DIAGNOSIS — Z0000 Encounter for general adult medical examination without abnormal findings: Secondary | ICD-10-CM | POA: Diagnosis not present

## 2019-03-27 DIAGNOSIS — C61 Malignant neoplasm of prostate: Secondary | ICD-10-CM | POA: Diagnosis not present

## 2019-04-05 DIAGNOSIS — Z8546 Personal history of malignant neoplasm of prostate: Secondary | ICD-10-CM | POA: Diagnosis not present

## 2019-04-05 DIAGNOSIS — N5231 Erectile dysfunction following radical prostatectomy: Secondary | ICD-10-CM | POA: Diagnosis not present

## 2019-05-04 DIAGNOSIS — C61 Malignant neoplasm of prostate: Secondary | ICD-10-CM | POA: Diagnosis not present

## 2019-05-04 DIAGNOSIS — N5231 Erectile dysfunction following radical prostatectomy: Secondary | ICD-10-CM | POA: Diagnosis not present

## 2019-05-05 ENCOUNTER — Telehealth: Payer: Self-pay | Admitting: Radiation Oncology

## 2019-05-05 NOTE — Telephone Encounter (Signed)
Received message from Dr. Tammi Klippel that this patient needs paperwork completed. To date no paperwork has been received. Phoned patient to inquire. No answer. Left detailed voicemail with my direct number requesting a return call for an update.

## 2019-05-08 ENCOUNTER — Telehealth: Payer: Self-pay | Admitting: Radiation Oncology

## 2019-05-08 NOTE — Telephone Encounter (Signed)
Received [employer] fax message that patient returned my call on 05/05/2019 at 1635. The fax message details that the "caller needs FMLA paperwork signed." Phoned patient back this morning (second attempt). No answer. Left message providing my direct number and explaining I have not received any FMLA paperwork for him. Awaiting return call.

## 2019-06-21 DIAGNOSIS — M9903 Segmental and somatic dysfunction of lumbar region: Secondary | ICD-10-CM | POA: Diagnosis not present

## 2019-06-21 DIAGNOSIS — M9901 Segmental and somatic dysfunction of cervical region: Secondary | ICD-10-CM | POA: Diagnosis not present

## 2019-06-21 DIAGNOSIS — M9902 Segmental and somatic dysfunction of thoracic region: Secondary | ICD-10-CM | POA: Diagnosis not present

## 2019-06-21 DIAGNOSIS — M47812 Spondylosis without myelopathy or radiculopathy, cervical region: Secondary | ICD-10-CM | POA: Diagnosis not present

## 2019-07-12 DIAGNOSIS — M9903 Segmental and somatic dysfunction of lumbar region: Secondary | ICD-10-CM | POA: Diagnosis not present

## 2019-07-12 DIAGNOSIS — M9902 Segmental and somatic dysfunction of thoracic region: Secondary | ICD-10-CM | POA: Diagnosis not present

## 2019-07-12 DIAGNOSIS — M9901 Segmental and somatic dysfunction of cervical region: Secondary | ICD-10-CM | POA: Diagnosis not present

## 2019-07-12 DIAGNOSIS — M47812 Spondylosis without myelopathy or radiculopathy, cervical region: Secondary | ICD-10-CM | POA: Diagnosis not present

## 2019-07-17 ENCOUNTER — Telehealth: Payer: Self-pay | Admitting: Radiation Oncology

## 2019-07-17 NOTE — Telephone Encounter (Signed)
Received voicemail message from patient requesting return call. Phoned patient back promptly. Patient verbalizes concern about discomfort with bowel movements and rectal spasms after. Patient denies seeing blood in his stool. Patient denies urinary complaints. Patient received his final prostate radiation treatment on 08/09/2018. Offered to make GI referral for further evaluation. Patient states, " Well Dr. Tammi Klippel did tell me it could take up until May 2021 for this to resolve." At the end of the conversation patient opted to wait another month and if the symptoms didn't resolve phone back and request referral,

## 2019-07-19 ENCOUNTER — Telehealth: Payer: Self-pay | Admitting: Radiation Oncology

## 2019-07-19 ENCOUNTER — Telehealth: Payer: Self-pay | Admitting: *Deleted

## 2019-07-19 ENCOUNTER — Encounter: Payer: Self-pay | Admitting: Physician Assistant

## 2019-07-19 DIAGNOSIS — M47812 Spondylosis without myelopathy or radiculopathy, cervical region: Secondary | ICD-10-CM | POA: Diagnosis not present

## 2019-07-19 DIAGNOSIS — M9903 Segmental and somatic dysfunction of lumbar region: Secondary | ICD-10-CM | POA: Diagnosis not present

## 2019-07-19 DIAGNOSIS — K6289 Other specified diseases of anus and rectum: Secondary | ICD-10-CM

## 2019-07-19 DIAGNOSIS — M9901 Segmental and somatic dysfunction of cervical region: Secondary | ICD-10-CM | POA: Diagnosis not present

## 2019-07-19 DIAGNOSIS — M9902 Segmental and somatic dysfunction of thoracic region: Secondary | ICD-10-CM | POA: Diagnosis not present

## 2019-07-19 NOTE — Telephone Encounter (Signed)
Called patient to inform of GI appt. For 08-02-19 - arrival time- 2:15 pm with Ellouise Newer  @ [facility]- address- [address], phone [phone], spoke with patient and he is aware of this appt.

## 2019-07-19 NOTE — Telephone Encounter (Signed)
Received voicemail message from patient request return call about medication to take for rectal discomfort with bowel movements. Phoned patient back and explained per Shona Simpson, PA-C Ibuprofen maybe helpful but if his creatinine is elevated this is contraindicated. Explained to the patient his creatinine in 2018 was elevated but I have no current labs to reflect upon. Reinforced my belief that a GI referral is most appropriate at this point and patient agreed. Patient understands that referral will be place and Romie Jumper will phone him with an appointment.

## 2019-07-31 DIAGNOSIS — M47812 Spondylosis without myelopathy or radiculopathy, cervical region: Secondary | ICD-10-CM | POA: Diagnosis not present

## 2019-07-31 DIAGNOSIS — M9903 Segmental and somatic dysfunction of lumbar region: Secondary | ICD-10-CM | POA: Diagnosis not present

## 2019-07-31 DIAGNOSIS — M9902 Segmental and somatic dysfunction of thoracic region: Secondary | ICD-10-CM | POA: Diagnosis not present

## 2019-07-31 DIAGNOSIS — M9901 Segmental and somatic dysfunction of cervical region: Secondary | ICD-10-CM | POA: Diagnosis not present

## 2019-08-02 ENCOUNTER — Encounter: Payer: Self-pay | Admitting: Physician Assistant

## 2019-08-02 ENCOUNTER — Ambulatory Visit: Payer: Federal, State, Local not specified - PPO | Admitting: Physician Assistant

## 2019-08-02 VITALS — BP 122/60 | HR 94 | Temp 97.5°F | Ht 68.0 in | Wt 166.0 lb

## 2019-08-02 DIAGNOSIS — K602 Anal fissure, unspecified: Secondary | ICD-10-CM | POA: Diagnosis not present

## 2019-08-02 MED ORDER — AMBULATORY NON FORMULARY MEDICATION: 0 refills | Status: AC

## 2019-08-02 NOTE — Patient Instructions (Signed)
If you are age 64 or older, your body mass index should be between 23-30. Your Body mass index is 25.24 kg/m. If this is out of the aforementioned range listed, please consider follow up with your Primary Care Provider.  If you are age 58 or younger, your body mass index should be between 19-25. Your Body mass index is 25.24 kg/m. If this is out of the aformentioned range listed, please consider follow up with your Primary Care Provider.    [facility]'s information is below: Address: [address], [place], [place] [address]  [phone]  *Please DO NOT go directly from our office to pick up this medication! Give the pharmacy 1 day to process the prescription as this is compounded and takes time to make.  Please purchase the following medications over the counter and take as directed:  Please use Recticare with lidocaine  Colace 1 tablet daily  Due to recent changes in healthcare laws, you may see the results of your imaging and laboratory studies on MyChart before your provider has had a chance to review them.  We understand that in some cases there may be results that are confusing or concerning to you. Not all laboratory results come back in the same time frame and the provider may be waiting for multiple results in order to interpret others.  Please give [place] 48 hours in order for your provider to thoroughly review all the results before contacting the office for clarification of your results.    How to Take a [employer] A sitz bath is a warm water bath that may be used to care for your rectum, genital area, or the area between your rectum and genitals (perineum). For a sitz bath, the water only comes up to your hips and covers your buttocks. A sitz bath may done at home in a bathtub or with a portable sitz bath that fits over the toilet. Your health care provider may recommend a sitz bath to help:  Relieve pain and discomfort after delivering a baby.  Relieve pain and  itching from hemorrhoids or anal fissures.  Relieve pain after certain surgeries.  Relax muscles that are sore or tight. How to take a sitz bath Take 3-4 sitz baths a day, or as many as told by your health care provider. Bathtub sitz bath To take a sitz bath in a bathtub: 1. Partially fill a bathtub with warm water. The water should be deep enough to cover your hips and buttocks when you are sitting in the tub. 2. If your health care provider told you to put medicine in the water, follow his or her instructions. 3. Sit in the water. 4. Open the tub drain a little, and leave it open during your bath. 5. Turn on the warm water again, enough to replace the water that is draining out. Keep the water running throughout your bath. This helps keep the water at the right level and the right temperature. 6. Soak in the water for 15-20 minutes, or as long as told by your health care provider. 7. When you are done, be careful when you stand up. You may feel dizzy. 8. After the sitz bath, pat yourself dry. Do not rub your skin to dry it.  Over-the-toilet sitz bath To take a sitz bath with an over-the-toilet basin: 1. Follow the manufacturer's instructions. 2. Fill the basin with warm water. 3. If your health care provider told you to put medicine in the water, follow his  or her instructions. 4. Sit on the seat. Make sure the water covers your buttocks and perineum. 5. Soak in the water for 15-20 minutes, or as long as told by your health care provider. 6. After the sitz bath, pat yourself dry. Do not rub your skin to dry it. 7. Clean and dry the basin between uses. 8. Discard the basin if it cracks, or according to the manufacturer's instructions. Contact a health care provider if:  Your symptoms get worse. Do not continue with sitz baths if your symptoms get worse.  You have new symptoms. If this happens, do not continue with sitz baths until you talk with your health care  provider. Summary  A sitz bath is a warm water bath in which the water only comes up to your hips and covers your buttocks.  A sitz bath may help relieve itching, relieve pain, and relax muscles that are sore or tight in the lower part of your body, including your genital area.  Take 3-4 sitz baths a day, or as many as told by your health care provider. Soak in the water for 15-20 minutes.  Do not continue with sitz baths if your symptoms get worse. This information is not intended to replace advice given to you by your health care provider. Make sure you discuss any questions you have with your health care provider. Document Revised: 09/05/2018 Document Reviewed: 04/08/2017 Elsevier Patient Education  [place].

## 2019-08-02 NOTE — Progress Notes (Signed)
Chief Complaint: Rectal pain  HPI:    [name] is a 64 year old male with a past medical history as listed below including prostate cancer status post radiation and prostatectomy, who was referred to me by Lujean Amel, MD for a complaint of rectal pain.      Today, the patient explains that he finished his radiation for prostate cancer April 21 of last year and he still has discomfort with a bowel movement.  Tells me this is a sharp pain that occurs when he is passing his stool, so bad that "sometimes I do not want to have a bowel movement".  Tells me that once it is passed he feels okay but within 5 minutes he gets another sharp pain which can last for a few minutes.  He has been using diaper cream which does not help.  His wife also got him some lidocaine which he tried once and it did soothe it a little bit.  Denies seeing any blood in his stool.  No change in bowel habits.    Patient does follow with Dr. Benson Norway it sounds like for his surveillance colonoscopies.  The last was 4 years ago per him.  Currently observing Ramadan and is fasting.    Denies fever, chills, weight loss or symptoms that awaken him from sleep.    Past Medical History:  Diagnosis Date  . Hypertension   . Prostate cancer [facility])     Past Surgical History:  Procedure Laterality Date  . KIDNEY SURGERY    . prostatectomy    . repair of ruptured achilles tendon      Current Outpatient Medications  Medication Sig Dispense Refill  . BLACK CURRANT SEED OIL PO Take by mouth.    . ELDERBERRY PO Take by mouth daily.    . hydrochlorothiazide (HYDRODIURIL) 25 MG tablet Take 25 mg by mouth daily.    . pravastatin (PRAVACHOL) 20 MG tablet Take 20 mg by mouth daily.     No current facility-administered medications for this visit.    Allergies as of 08/02/2019 - Review Complete 08/02/2019  Allergen Reaction Noted  . Other  04/19/2018    Family History  Problem Relation Age of Onset  . Melanoma Mother   . Kidney  disease Father     Social History   Socioeconomic History  . Marital status: Divorced    Spouse name: Not on file  . Number of children: Not on file  . Years of education: Not on file  . Highest education level: Not on file  Occupational History    Comment: full time  Tobacco Use  . Smoking status: Never Smoker  . Smokeless tobacco: Never Used  Substance and Sexual Activity  . Alcohol use: No  . Drug use: No  . Sexual activity: Not Currently  Other Topics Concern  . Not on file  Social History Narrative  . Not on file   Social Determinants of Health   Financial Resource Strain:   . Difficulty of Paying Living Expenses:   Food Insecurity:   . Worried About Charity fundraiser in the Last Year:   . Arboriculturist in the Last Year:   Transportation Needs:   . Film/video editor (Medical):   Marland Kitchen Lack of Transportation (Non-Medical):   Physical Activity:   . Days of Exercise per Week:   . Minutes of Exercise per Session:   Stress:   . Feeling of Stress :   Social Connections:   .  Frequency of Communication with Friends and Family:   . Frequency of Social Gatherings with Friends and Family:   . Attends Religious Services:   . Active Member of Clubs or Organizations:   . Attends Archivist Meetings:   Marland Kitchen Marital Status:   Intimate Partner Violence:   . Fear of Current or Ex-Partner:   . Emotionally Abused:   Marland Kitchen Physically Abused:   . Sexually Abused:     Review of Systems:    Constitutional: No weight loss, fever or chills Skin: No rash  Cardiovascular: No chest pain Respiratory: No SOB  Gastrointestinal: See HPI and otherwise negative Genitourinary: No dysuria  Neurological: No headache, dizziness or syncope Musculoskeletal: No new muscle or joint pain Hematologic: No bleeding  Psychiatric: No history of depression or anxiety   Physical Exam:  Vital signs: BP 122/60   Pulse 94   Temp (!) 97.5 F (36.4 C)   Ht 5\' 8"  (1.727 m)   Wt 166 lb  (75.3 kg)   BMI 25.24 kg/m   Constitutional:   Pleasant AA male appears to be in NAD, Well developed, Well nourished, alert and cooperative Head:  Normocephalic and atraumatic. Eyes:   PEERL, EOMI. No icterus. Conjunctiva pink. Ears:  Normal auditory acuity. Neck:  Supple Throat: Oral cavity and pharynx without inflammation, swelling or lesion.  Respiratory: Respirations even and unlabored. Lungs clear to auscultation bilaterally.   No wheezes, crackles, or rhonchi.  Cardiovascular: Normal S1, S2. No MRG. Regular rate and rhythm. No peripheral edema, cyanosis or pallor.  Gastrointestinal:  Soft, nondistended, nontender. No rebound or guarding. Normal bowel sounds. No appreciable masses or hepatomegaly. Rectal:  External: left lateral fissure tender to palpation, external hemorrhoid tag; Internal: deferred due to pain Msk:  Symmetrical without gross deformities. Without edema, no deformity or joint abnormality.  Neurologic:  Alert and  oriented x4;  grossly normal neurologically.  Skin:   Dry and intact without significant lesions or rashes. Psychiatric: Demonstrates good judgement and reason without abnormal affect or behaviors.  No recent labs.  Assessment: 1.  Anal fissure: With rectal pain, seen at time of exam today  Plan: 1.  Prescribed nitroglycerin ointment 0.125% 3 times daily x6-8 weeks.  Discussed this is compounded [facility]. 2.  We will request records from Dr. Benson Norway for patient's colonoscopies.  It sounds like he will be due in a year. 3.  Discussed over-the-counter RectiCare cream with lidocaine.  He can apply this as needed.  It might be helpful before a bowel movement. 4.  Recommend sitz bath's for 15 to 20 minutes 2-3 times a day. 5.  Discussed starting Colace stool softener 1-2 tabs daily. 6.  Patient requests a male doctor today.  He was assigned to Dr. Hyman Hopes, PA-C [facility] 08/02/2019, 2:33 PM  Cc: Lujean Amel,  MD

## 2019-08-02 NOTE — Progress Notes (Signed)
Attending Physician's Attestation   I have reviewed the chart.   I agree with the Advanced Practitioner's note, impression, and recommendations with any updates as below.    Fredrica Capano Mansouraty, MD [facility] Advanced Endoscopy Office # [phone]  

## 2019-08-08 DIAGNOSIS — M9902 Segmental and somatic dysfunction of thoracic region: Secondary | ICD-10-CM | POA: Diagnosis not present

## 2019-08-08 DIAGNOSIS — M47812 Spondylosis without myelopathy or radiculopathy, cervical region: Secondary | ICD-10-CM | POA: Diagnosis not present

## 2019-08-08 DIAGNOSIS — M9901 Segmental and somatic dysfunction of cervical region: Secondary | ICD-10-CM | POA: Diagnosis not present

## 2019-08-08 DIAGNOSIS — M9903 Segmental and somatic dysfunction of lumbar region: Secondary | ICD-10-CM | POA: Diagnosis not present

## 2019-08-23 DIAGNOSIS — M47812 Spondylosis without myelopathy or radiculopathy, cervical region: Secondary | ICD-10-CM | POA: Diagnosis not present

## 2019-08-23 DIAGNOSIS — M9903 Segmental and somatic dysfunction of lumbar region: Secondary | ICD-10-CM | POA: Diagnosis not present

## 2019-08-23 DIAGNOSIS — M9902 Segmental and somatic dysfunction of thoracic region: Secondary | ICD-10-CM | POA: Diagnosis not present

## 2019-08-23 DIAGNOSIS — M9901 Segmental and somatic dysfunction of cervical region: Secondary | ICD-10-CM | POA: Diagnosis not present

## 2019-09-05 DIAGNOSIS — M47812 Spondylosis without myelopathy or radiculopathy, cervical region: Secondary | ICD-10-CM | POA: Diagnosis not present

## 2019-09-05 DIAGNOSIS — M9902 Segmental and somatic dysfunction of thoracic region: Secondary | ICD-10-CM | POA: Diagnosis not present

## 2019-09-05 DIAGNOSIS — M9903 Segmental and somatic dysfunction of lumbar region: Secondary | ICD-10-CM | POA: Diagnosis not present

## 2019-09-05 DIAGNOSIS — M9901 Segmental and somatic dysfunction of cervical region: Secondary | ICD-10-CM | POA: Diagnosis not present

## 2019-09-26 DIAGNOSIS — M25552 Pain in left hip: Secondary | ICD-10-CM | POA: Diagnosis not present

## 2019-10-03 DIAGNOSIS — C61 Malignant neoplasm of prostate: Secondary | ICD-10-CM | POA: Diagnosis not present

## 2019-10-19 DIAGNOSIS — M1612 Unilateral primary osteoarthritis, left hip: Secondary | ICD-10-CM | POA: Diagnosis not present

## 2019-11-14 DIAGNOSIS — N5231 Erectile dysfunction following radical prostatectomy: Secondary | ICD-10-CM | POA: Diagnosis not present

## 2019-11-14 DIAGNOSIS — Z8546 Personal history of malignant neoplasm of prostate: Secondary | ICD-10-CM | POA: Diagnosis not present

## 2019-11-30 DIAGNOSIS — D649 Anemia, unspecified: Secondary | ICD-10-CM | POA: Diagnosis not present

## 2019-11-30 DIAGNOSIS — Z01818 Encounter for other preprocedural examination: Secondary | ICD-10-CM | POA: Diagnosis not present

## 2019-11-30 DIAGNOSIS — I10 Essential (primary) hypertension: Secondary | ICD-10-CM | POA: Diagnosis not present

## 2019-11-30 DIAGNOSIS — E7800 Pure hypercholesterolemia, unspecified: Secondary | ICD-10-CM | POA: Diagnosis not present

## 2019-12-04 ENCOUNTER — Other Ambulatory Visit: Payer: Self-pay | Admitting: Family Medicine

## 2019-12-04 ENCOUNTER — Ambulatory Visit
Admission: RE | Admit: 2019-12-04 | Discharge: 2019-12-04 | Disposition: A | Discharge status: Home / Self Care | Payer: Federal, State, Local not specified - PPO | Source: Ambulatory Visit | Attending: Family Medicine | Admitting: Family Medicine

## 2019-12-04 DIAGNOSIS — Z01818 Encounter for other preprocedural examination: Secondary | ICD-10-CM

## 2019-12-04 DIAGNOSIS — M47814 Spondylosis without myelopathy or radiculopathy, thoracic region: Secondary | ICD-10-CM | POA: Diagnosis not present

## 2019-12-09 DIAGNOSIS — H40033 Anatomical narrow angle, bilateral: Secondary | ICD-10-CM | POA: Diagnosis not present

## 2019-12-09 DIAGNOSIS — H2513 Age-related nuclear cataract, bilateral: Secondary | ICD-10-CM | POA: Diagnosis not present

## 2019-12-14 DIAGNOSIS — Z01818 Encounter for other preprocedural examination: Secondary | ICD-10-CM | POA: Diagnosis not present

## 2019-12-19 DIAGNOSIS — M1612 Unilateral primary osteoarthritis, left hip: Secondary | ICD-10-CM | POA: Diagnosis not present

## 2020-01-30 DIAGNOSIS — Z96642 Presence of left artificial hip joint: Secondary | ICD-10-CM | POA: Diagnosis not present

## 2020-05-01 DIAGNOSIS — H2513 Age-related nuclear cataract, bilateral: Secondary | ICD-10-CM | POA: Diagnosis not present

## 2020-05-01 DIAGNOSIS — H40033 Anatomical narrow angle, bilateral: Secondary | ICD-10-CM | POA: Diagnosis not present

## 2020-05-09 DIAGNOSIS — C61 Malignant neoplasm of prostate: Secondary | ICD-10-CM | POA: Diagnosis not present

## 2020-08-20 DIAGNOSIS — M9902 Segmental and somatic dysfunction of thoracic region: Secondary | ICD-10-CM | POA: Diagnosis not present

## 2020-08-20 DIAGNOSIS — M4722 Other spondylosis with radiculopathy, cervical region: Secondary | ICD-10-CM | POA: Diagnosis not present

## 2020-08-20 DIAGNOSIS — M9903 Segmental and somatic dysfunction of lumbar region: Secondary | ICD-10-CM | POA: Diagnosis not present

## 2020-08-20 DIAGNOSIS — M9901 Segmental and somatic dysfunction of cervical region: Secondary | ICD-10-CM | POA: Diagnosis not present

## 2020-08-27 DIAGNOSIS — M9903 Segmental and somatic dysfunction of lumbar region: Secondary | ICD-10-CM | POA: Diagnosis not present

## 2020-08-27 DIAGNOSIS — M4722 Other spondylosis with radiculopathy, cervical region: Secondary | ICD-10-CM | POA: Diagnosis not present

## 2020-08-27 DIAGNOSIS — M9902 Segmental and somatic dysfunction of thoracic region: Secondary | ICD-10-CM | POA: Diagnosis not present

## 2020-08-27 DIAGNOSIS — M9901 Segmental and somatic dysfunction of cervical region: Secondary | ICD-10-CM | POA: Diagnosis not present

## 2020-09-11 DIAGNOSIS — M9901 Segmental and somatic dysfunction of cervical region: Secondary | ICD-10-CM | POA: Diagnosis not present

## 2020-09-11 DIAGNOSIS — M9902 Segmental and somatic dysfunction of thoracic region: Secondary | ICD-10-CM | POA: Diagnosis not present

## 2020-09-11 DIAGNOSIS — M4722 Other spondylosis with radiculopathy, cervical region: Secondary | ICD-10-CM | POA: Diagnosis not present

## 2020-09-11 DIAGNOSIS — M9903 Segmental and somatic dysfunction of lumbar region: Secondary | ICD-10-CM | POA: Diagnosis not present

## 2020-10-02 DIAGNOSIS — M9902 Segmental and somatic dysfunction of thoracic region: Secondary | ICD-10-CM | POA: Diagnosis not present

## 2020-10-02 DIAGNOSIS — M4722 Other spondylosis with radiculopathy, cervical region: Secondary | ICD-10-CM | POA: Diagnosis not present

## 2020-10-02 DIAGNOSIS — M9903 Segmental and somatic dysfunction of lumbar region: Secondary | ICD-10-CM | POA: Diagnosis not present

## 2020-10-02 DIAGNOSIS — M9901 Segmental and somatic dysfunction of cervical region: Secondary | ICD-10-CM | POA: Diagnosis not present

## 2020-10-30 DIAGNOSIS — M4722 Other spondylosis with radiculopathy, cervical region: Secondary | ICD-10-CM | POA: Diagnosis not present

## 2020-10-30 DIAGNOSIS — M9902 Segmental and somatic dysfunction of thoracic region: Secondary | ICD-10-CM | POA: Diagnosis not present

## 2020-10-30 DIAGNOSIS — M9901 Segmental and somatic dysfunction of cervical region: Secondary | ICD-10-CM | POA: Diagnosis not present

## 2020-10-30 DIAGNOSIS — M9903 Segmental and somatic dysfunction of lumbar region: Secondary | ICD-10-CM | POA: Diagnosis not present

## 2022-07-20 IMAGING — DX DG CHEST 2V
2 series · 2 of 2 positions shown · non-contrast
Comparison: 08/23/2013 chest radiograph and prior.

CLINICAL DATA: Preop evaluation.

EXAM:
CHEST - 2 VIEW

[dg chest 2 view (1 of 2)]
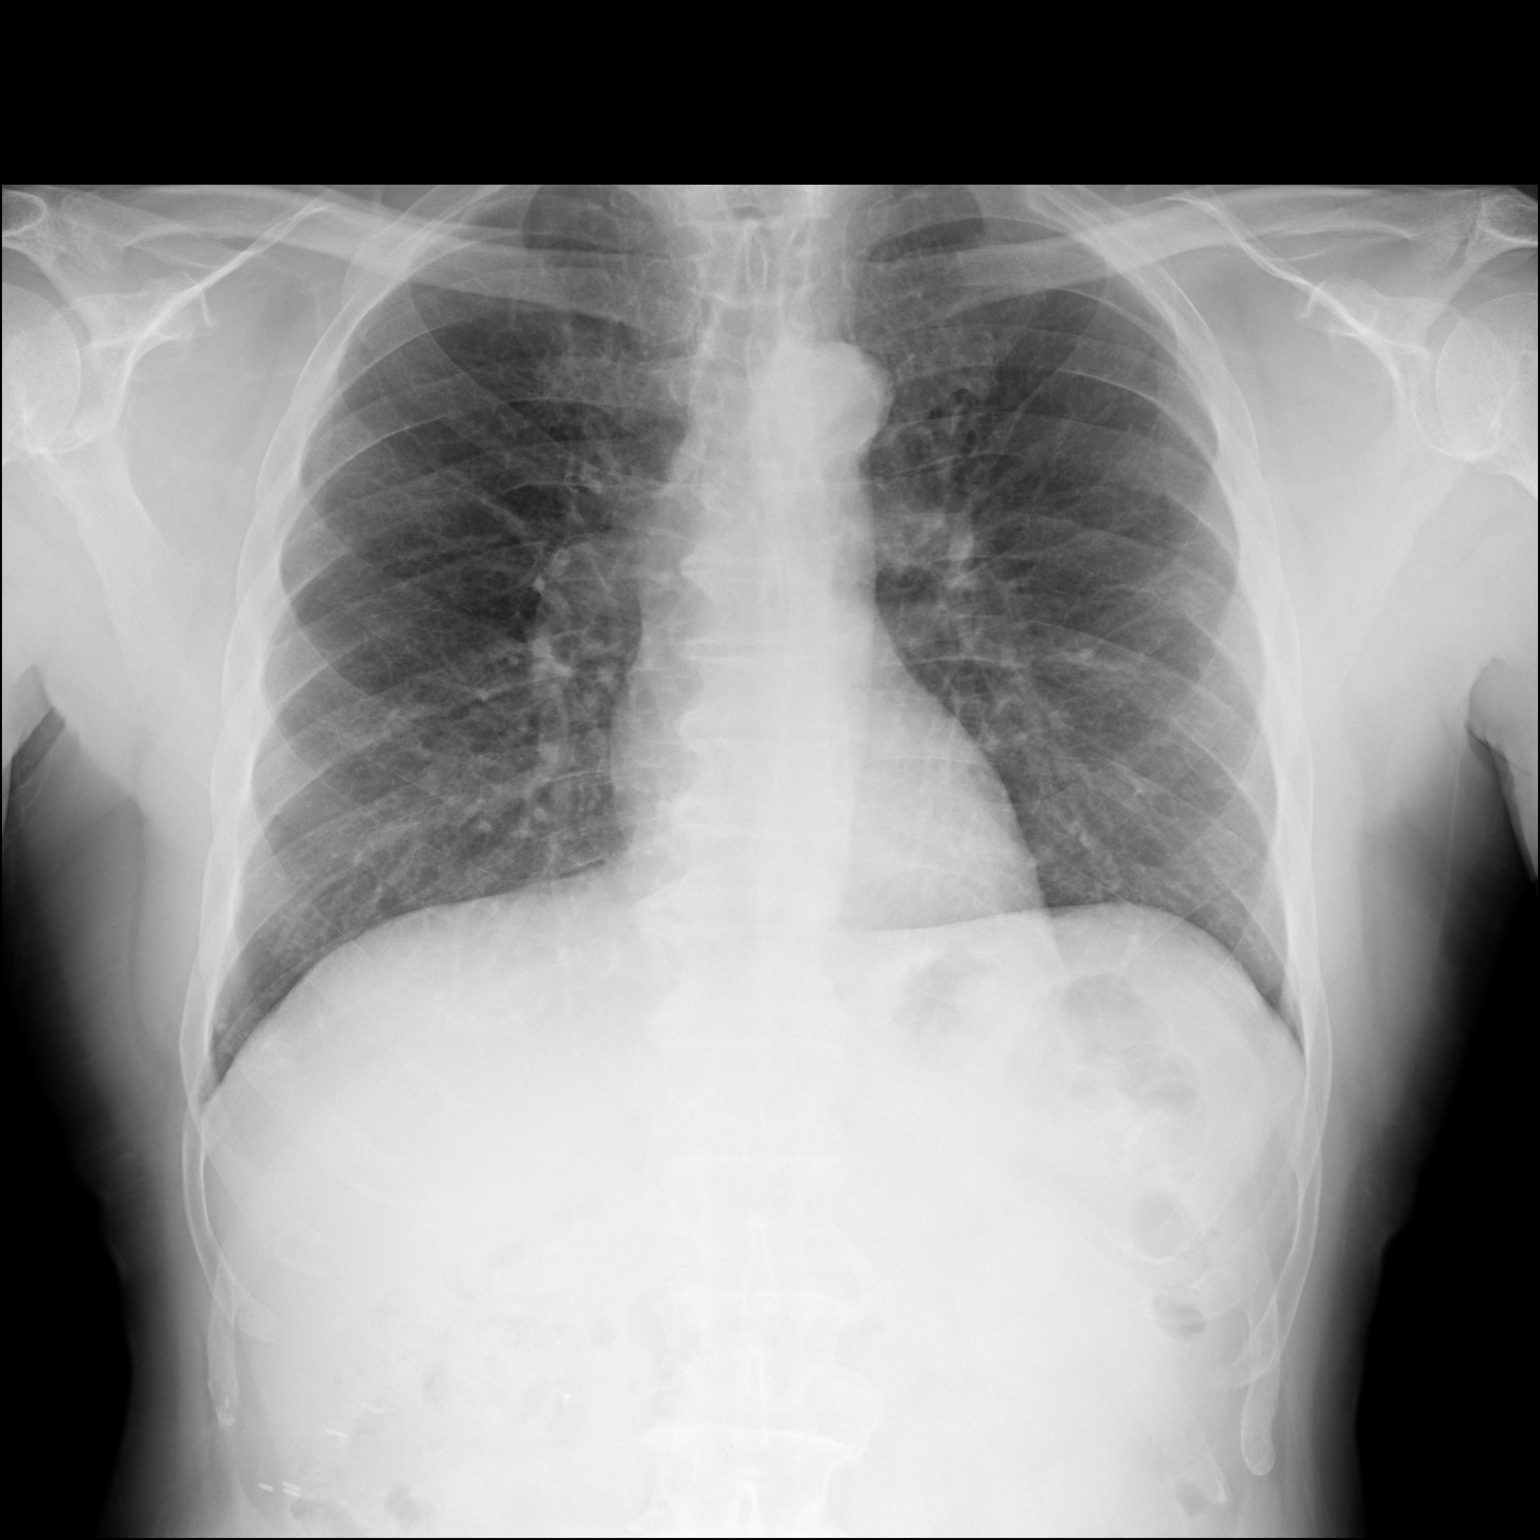

[dg chest 2 view (2 of 2)]
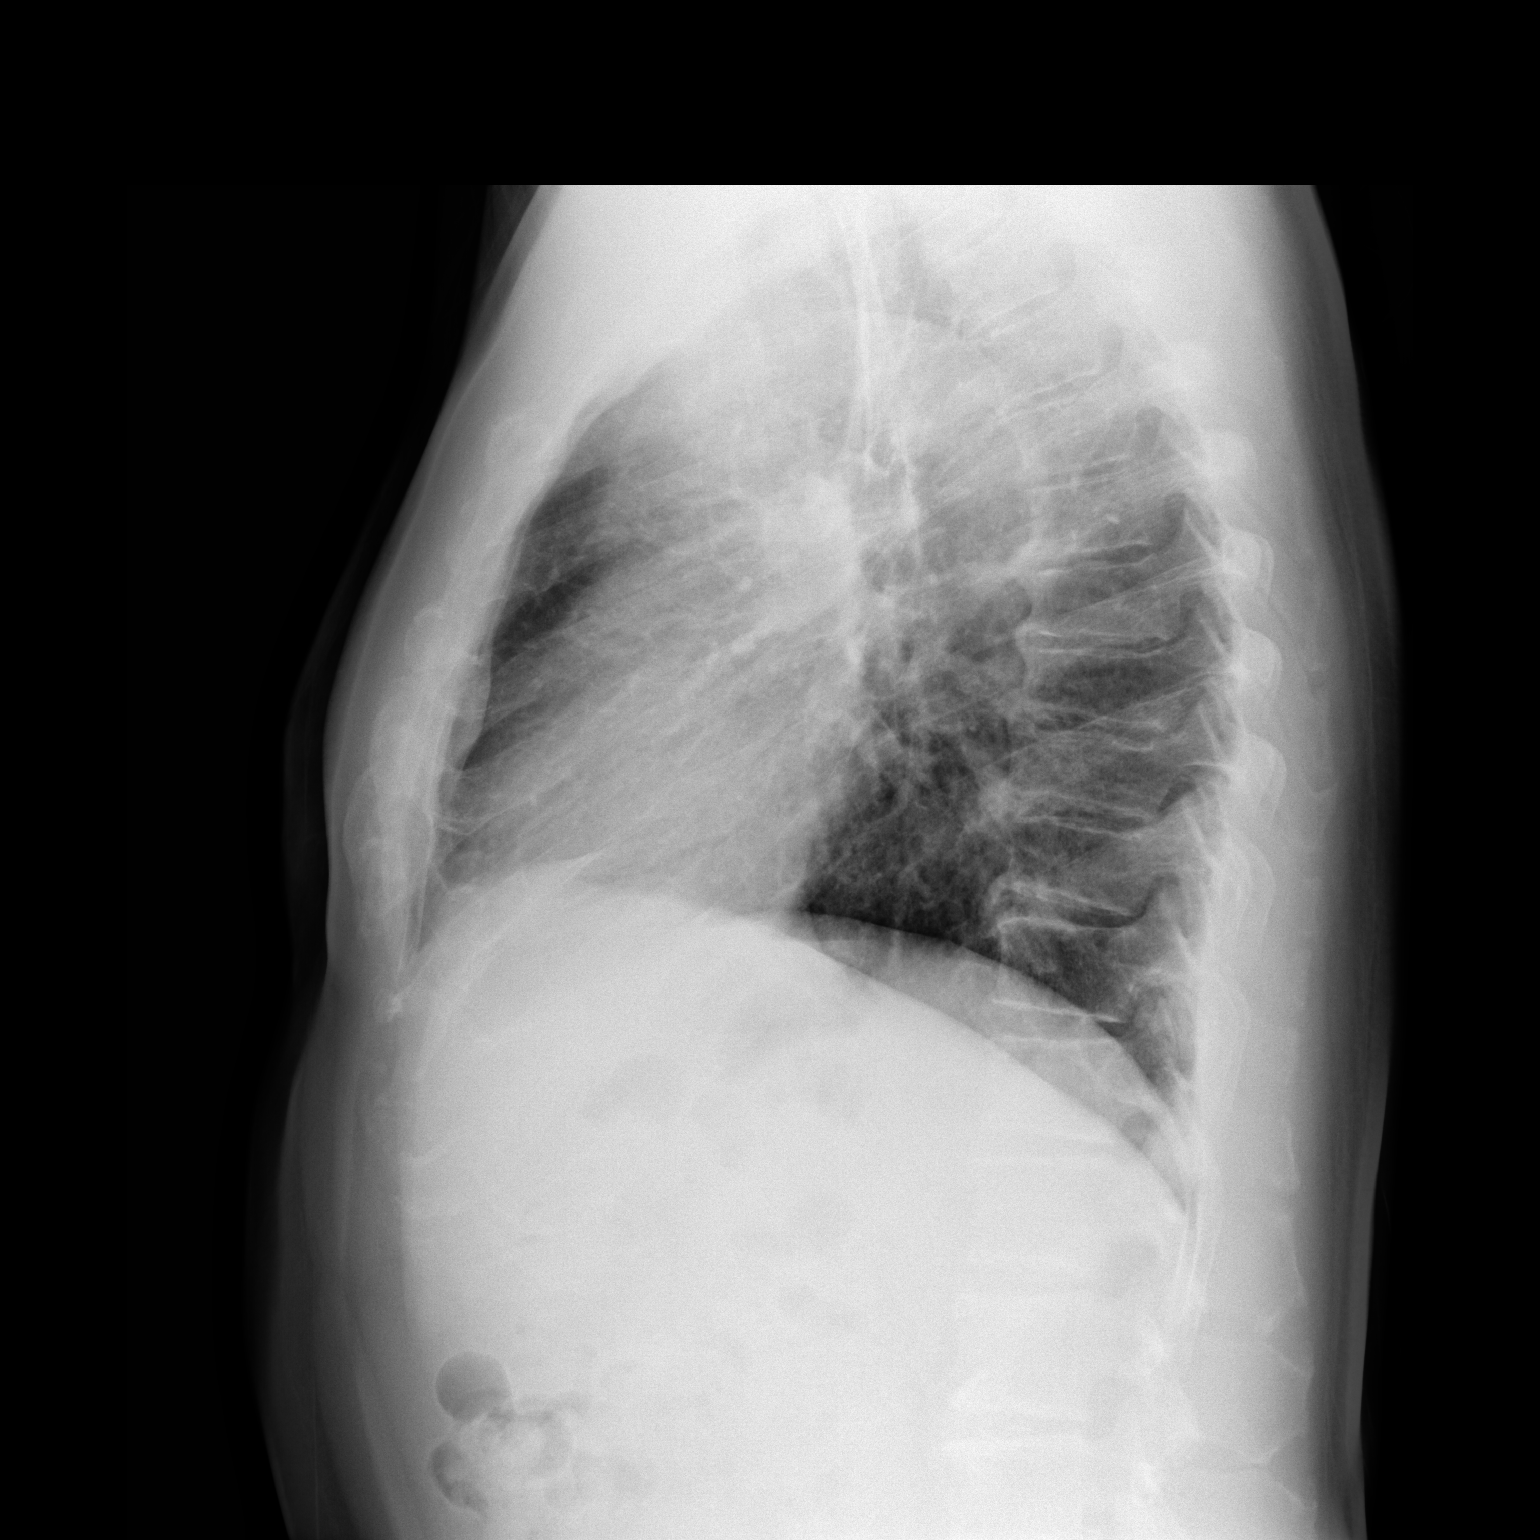

[2 of 2 positions shown; findings below may reference images not displayed]

FINDINGS: The heart size and mediastinal contours are within normal limits.
Both lungs are clear. Mild interstitial prominence is unchanged. No
pneumothorax or pleural effusion. Multilevel spondylosis. Right
upper quadrant surgical clips.
IMPRESSION: No focal airspace disease.

## 2024-01-13 ENCOUNTER — Other Ambulatory Visit (HOSPITAL_BASED_OUTPATIENT_CLINIC_OR_DEPARTMENT_OTHER): Payer: Self-pay | Admitting: Family Medicine

## 2024-01-13 DIAGNOSIS — E7800 Pure hypercholesterolemia, unspecified: Secondary | ICD-10-CM
